# Patient Record
Sex: Female | Born: 1980 | Race: White | Hispanic: No | Smoking: Former smoker
Health system: Southern US, Community
[De-identification: ages and names within clinical notes are randomized; demographics above are authoritative.]

## PROBLEM LIST (undated history)

## (undated) DIAGNOSIS — K580 Irritable bowel syndrome with diarrhea: Secondary | ICD-10-CM

## (undated) DIAGNOSIS — R42 Dizziness and giddiness: Secondary | ICD-10-CM

## (undated) DIAGNOSIS — M545 Low back pain: Secondary | ICD-10-CM

## (undated) DIAGNOSIS — G629 Polyneuropathy, unspecified: Secondary | ICD-10-CM

## (undated) DIAGNOSIS — K089 Disorder of teeth and supporting structures, unspecified: Secondary | ICD-10-CM

## (undated) DIAGNOSIS — R269 Unspecified abnormalities of gait and mobility: Secondary | ICD-10-CM

## (undated) DIAGNOSIS — Z8489 Family history of other specified conditions: Secondary | ICD-10-CM

## (undated) DIAGNOSIS — Z87448 Personal history of other diseases of urinary system: Secondary | ICD-10-CM

## (undated) DIAGNOSIS — G894 Chronic pain syndrome: Secondary | ICD-10-CM

## (undated) DIAGNOSIS — D497 Neoplasm of unspecified behavior of endocrine glands and other parts of nervous system: Secondary | ICD-10-CM

## (undated) DIAGNOSIS — M542 Cervicalgia: Secondary | ICD-10-CM

## (undated) DIAGNOSIS — I10 Essential (primary) hypertension: Secondary | ICD-10-CM

## (undated) DIAGNOSIS — R000 Tachycardia, unspecified: Secondary | ICD-10-CM

## (undated) DIAGNOSIS — N390 Urinary tract infection, site not specified: Secondary | ICD-10-CM

## (undated) DIAGNOSIS — E669 Obesity, unspecified: Secondary | ICD-10-CM

## (undated) DIAGNOSIS — R51 Headache: Secondary | ICD-10-CM

## (undated) DIAGNOSIS — F32A Depression, unspecified: Secondary | ICD-10-CM

## (undated) DIAGNOSIS — F329 Major depressive disorder, single episode, unspecified: Secondary | ICD-10-CM

## (undated) DIAGNOSIS — R209 Unspecified disturbances of skin sensation: Secondary | ICD-10-CM

## (undated) DIAGNOSIS — F419 Anxiety disorder, unspecified: Secondary | ICD-10-CM

## (undated) HISTORY — DX: Chronic pain syndrome: G89.4

## (undated) HISTORY — DX: Unspecified abnormalities of gait and mobility: R26.9

## (undated) HISTORY — DX: Dizziness and giddiness: R42

## (undated) HISTORY — DX: Tachycardia, unspecified: R00.0

## (undated) HISTORY — DX: Cervicalgia: M54.2

## (undated) HISTORY — DX: Low back pain: M54.5

## (undated) HISTORY — PX: NO PAST SURGERIES: SHX2092

## (undated) HISTORY — DX: Unspecified disturbances of skin sensation: R20.9

## (undated) HISTORY — DX: Polyneuropathy, unspecified: G62.9

## (undated) HISTORY — DX: Obesity, unspecified: E66.9

---

## 1998-01-29 ENCOUNTER — Other Ambulatory Visit: Admission: RE | Admit: 1998-01-29 | Discharge: 1998-01-29 | Payer: Self-pay | Admitting: Obstetrics and Gynecology

## 1998-05-09 ENCOUNTER — Inpatient Hospital Stay (HOSPITAL_COMMUNITY): Admission: AD | Admit: 1998-05-09 | Discharge: 1998-05-09 | Payer: Self-pay | Admitting: Obstetrics and Gynecology

## 1998-08-11 ENCOUNTER — Inpatient Hospital Stay (HOSPITAL_COMMUNITY): Admission: AD | Admit: 1998-08-11 | Discharge: 1998-08-11 | Payer: Self-pay | Admitting: Obstetrics & Gynecology

## 1998-08-13 ENCOUNTER — Inpatient Hospital Stay (HOSPITAL_COMMUNITY): Admission: AD | Admit: 1998-08-13 | Discharge: 1998-08-15 | Payer: Self-pay | Admitting: Obstetrics and Gynecology

## 1998-08-13 ENCOUNTER — Inpatient Hospital Stay (HOSPITAL_COMMUNITY): Admission: AD | Admit: 1998-08-13 | Discharge: 1998-08-13 | Payer: Self-pay | Admitting: Obstetrics and Gynecology

## 1998-08-16 ENCOUNTER — Inpatient Hospital Stay (HOSPITAL_COMMUNITY): Admission: AD | Admit: 1998-08-16 | Discharge: 1998-08-18 | Payer: Self-pay | Admitting: Obstetrics & Gynecology

## 2007-08-23 ENCOUNTER — Emergency Department (HOSPITAL_COMMUNITY): Admission: EM | Admit: 2007-08-23 | Discharge: 2007-08-23 | Payer: Self-pay | Admitting: Emergency Medicine

## 2007-08-24 ENCOUNTER — Emergency Department (HOSPITAL_COMMUNITY): Admission: EM | Admit: 2007-08-24 | Discharge: 2007-08-24 | Payer: Self-pay | Admitting: Emergency Medicine

## 2007-12-13 ENCOUNTER — Encounter: Admission: RE | Admit: 2007-12-13 | Discharge: 2007-12-13 | Payer: Self-pay | Admitting: Family Medicine

## 2008-01-10 ENCOUNTER — Encounter: Admission: RE | Admit: 2008-01-10 | Discharge: 2008-01-10 | Payer: Self-pay | Admitting: Family Medicine

## 2009-07-10 IMAGING — US US ABDOMEN COMPLETE
1 series · 14 of 25 positions shown · non-contrast
Comparison: CT of 08/23/2007

CLINICAL DATA: Elevated liver function tests.  Hepatomegaly.

ABDOMEN ULTRASOUND
TECHNIQUE: Complete abdominal ultrasound examination was performed
including evaluation of the liver, gallbladder, bile ducts,
pancreas, kidneys, spleen, IVC, and abdominal aorta.

[Series 1: us abdomen complete · 0.32mm/px · 14 of 100 slices shown]
[im 1/100]
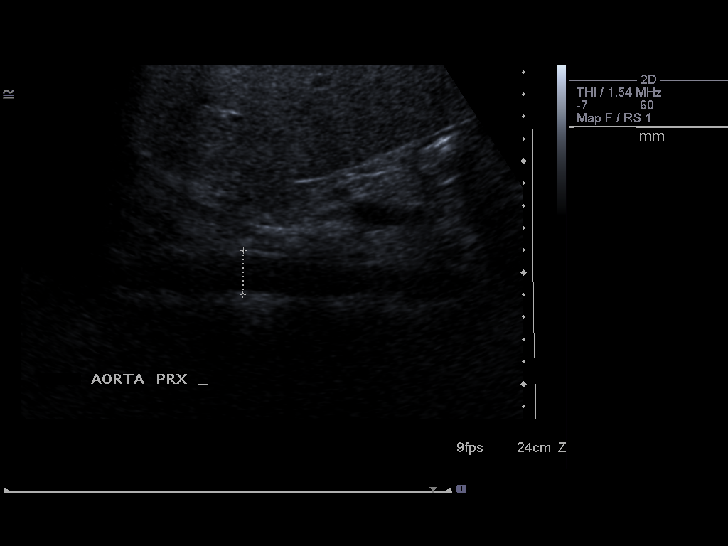
[im 9/100]
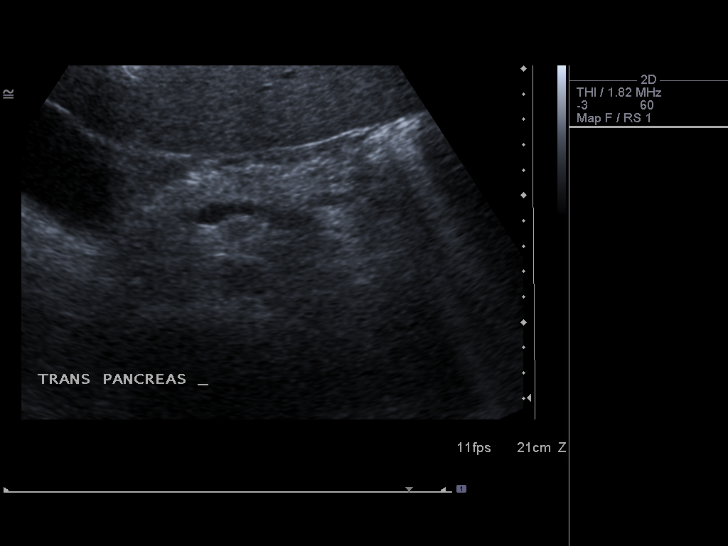
[im 17/100]
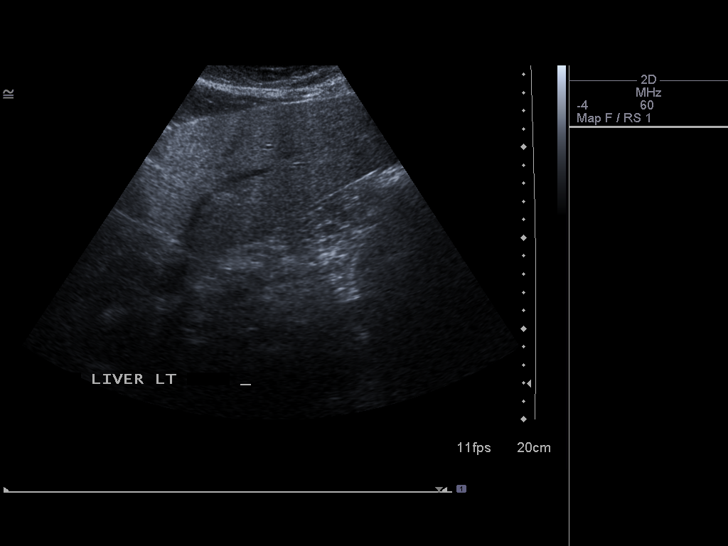
[im 25/100]
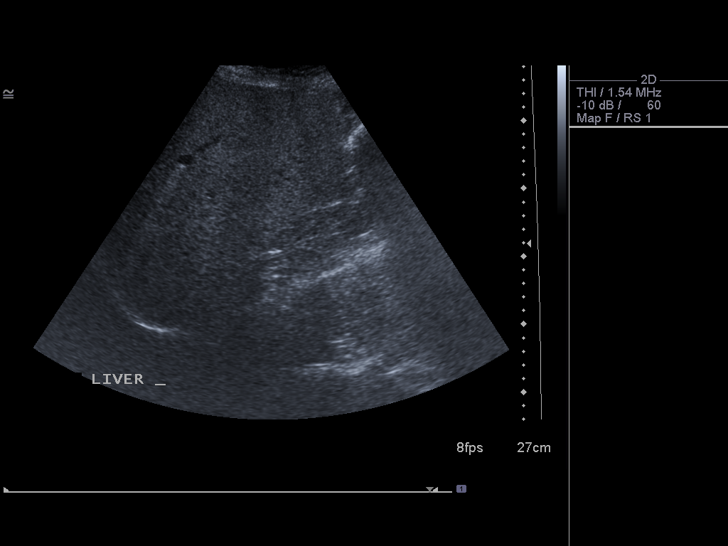
[im 34/100]
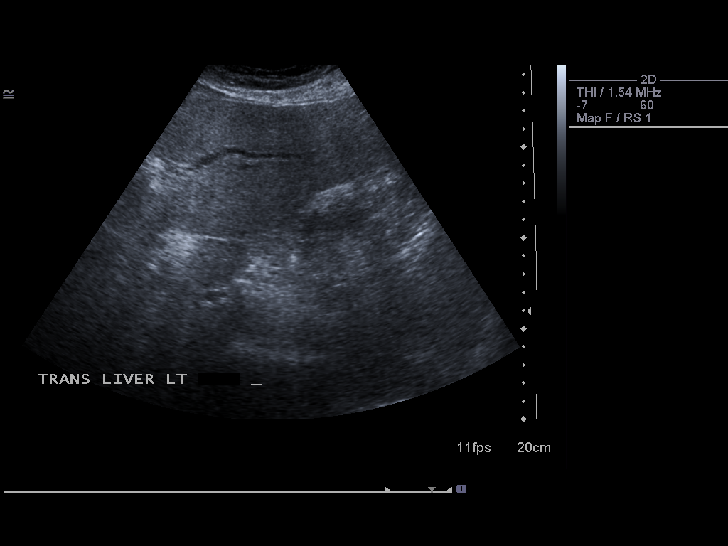
[im 38/100]
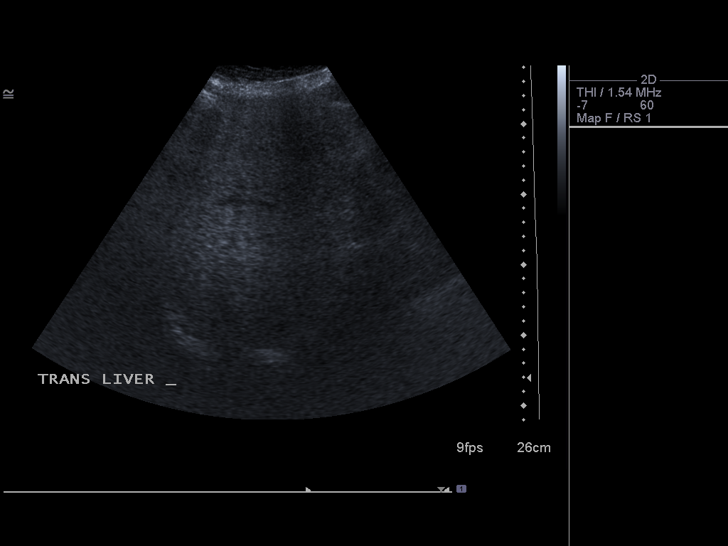
[im 46/100]
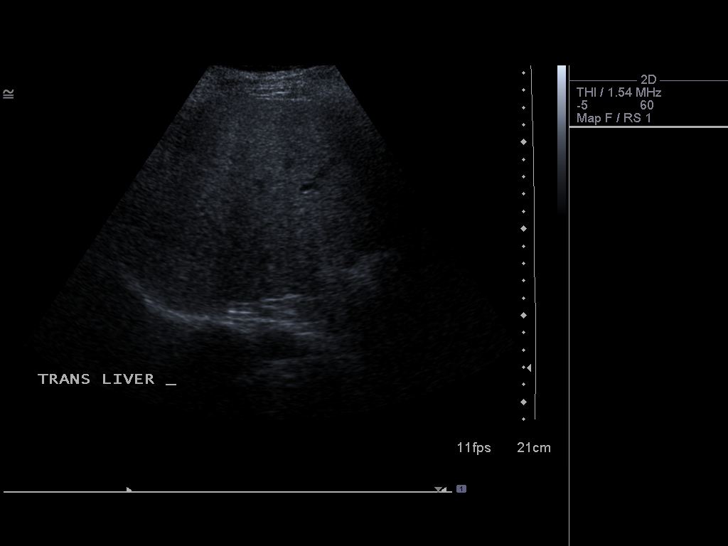
[im 54/100]
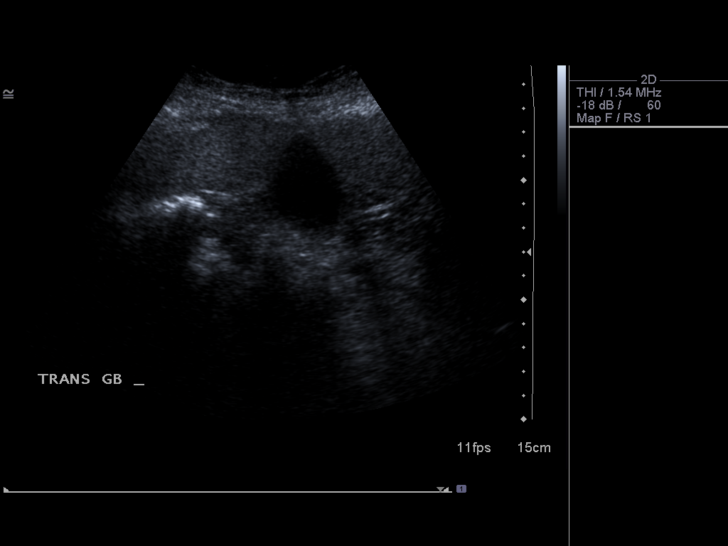
[im 62/100]
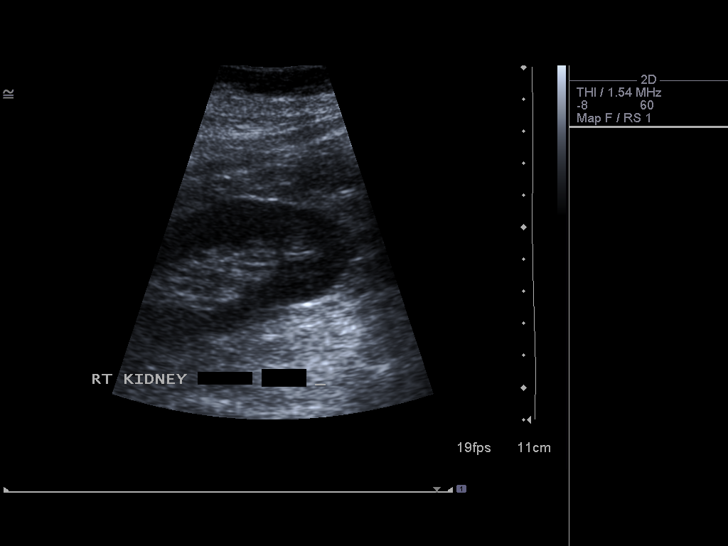
[im 67/100]
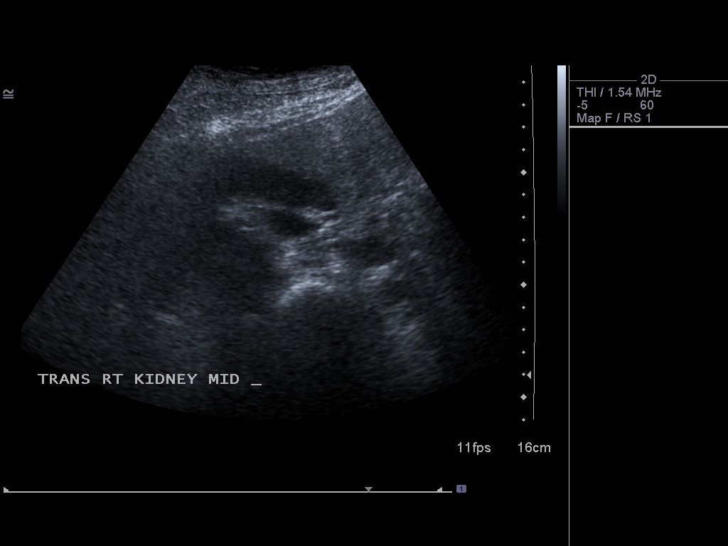
[im 75/100]
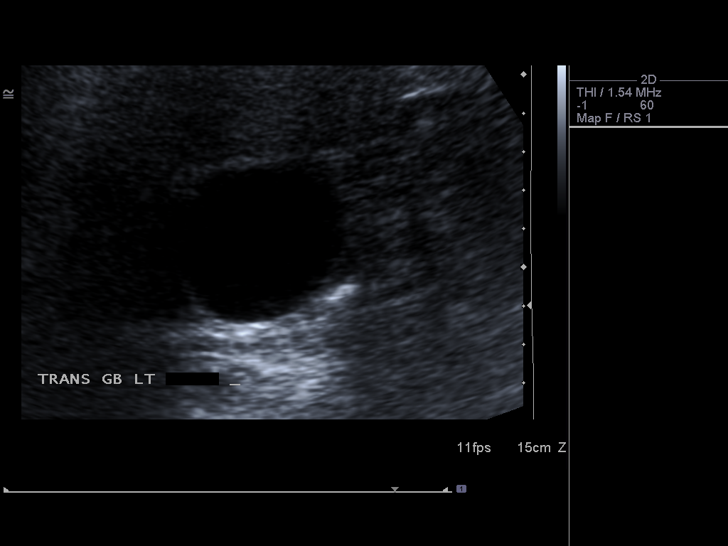
[im 83/100]
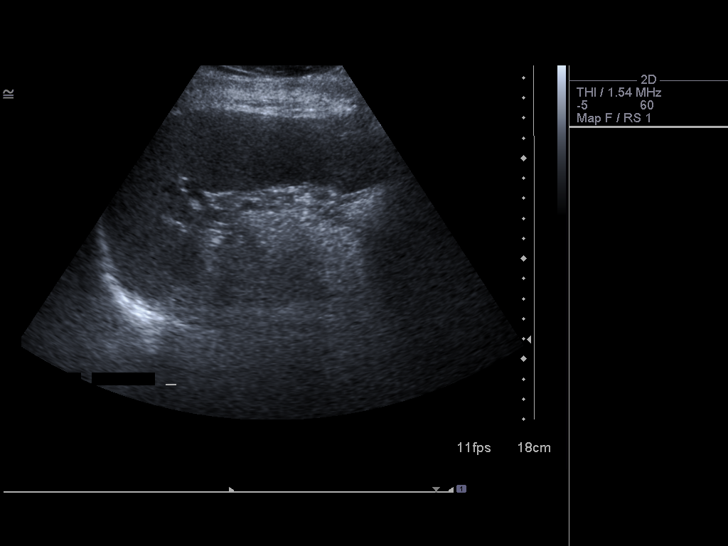
[im 91/100]
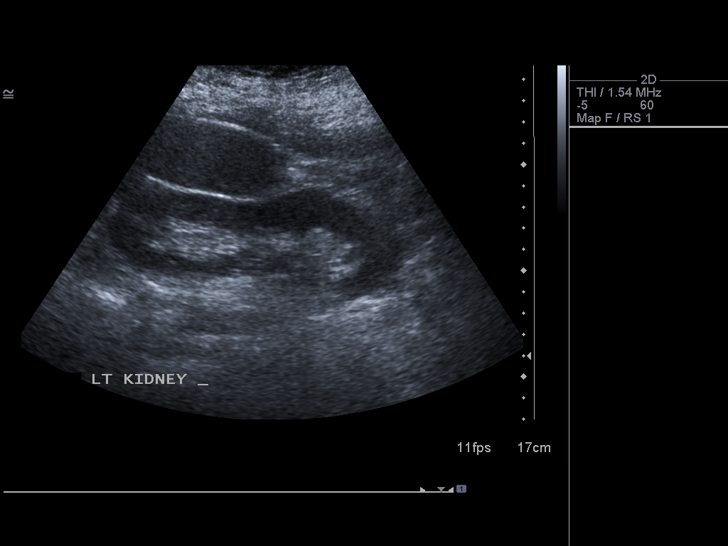
[im 100/100]
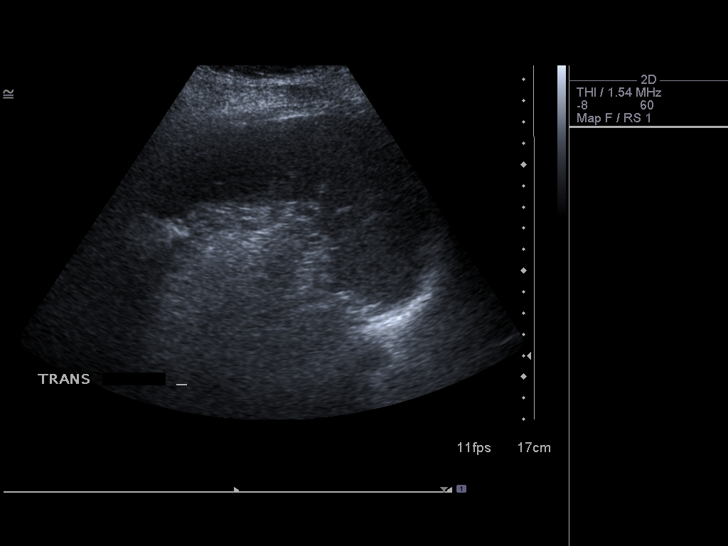

[14 of 25 positions shown; findings below may reference images not displayed]

FINDINGS: Gallbladder normal without wall thickening, stone, or
pericholecystic fluid.  Common duct normal at 5 mm.  Sonographic
Murphy's sign not elicited.

The liver is enlarged with increased echogenicity likely related to
fatty infiltration.  Maximal dimension 22 cm.

IVC normal.  Minimally limited evaluation of the pancreas.

The spleen is mildly enlarged with the splenic volume of
approximately 560 ml and maximum splenic length of 13.7 cm.

Right kidney 13.7 and left kidney 13.7cm.  No hydronephrosis.
Right-sided extrarenal pelvis.

Abdominal aorta nonaneurysmal without ascites.
IMPRESSION: 1.  Hepatomegaly with fatty infiltration of the liver, moderate.
2.  Mild splenomegaly.

## 2010-02-27 ENCOUNTER — Emergency Department (HOSPITAL_COMMUNITY)
Admission: EM | Admit: 2010-02-27 | Discharge: 2010-02-27 | Payer: Self-pay | Source: Home / Self Care | Admitting: Emergency Medicine

## 2010-03-06 ENCOUNTER — Encounter: Payer: Self-pay | Admitting: Family Medicine

## 2010-07-14 ENCOUNTER — Emergency Department (HOSPITAL_COMMUNITY)
Admission: EM | Admit: 2010-07-14 | Discharge: 2010-07-14 | Disposition: A | Discharge status: Home / Self Care | Payer: Medicaid Other | Attending: Emergency Medicine | Admitting: Emergency Medicine

## 2010-07-14 DIAGNOSIS — E669 Obesity, unspecified: Secondary | ICD-10-CM | POA: Insufficient documentation

## 2010-07-14 DIAGNOSIS — M79609 Pain in unspecified limb: Secondary | ICD-10-CM | POA: Insufficient documentation

## 2010-07-14 DIAGNOSIS — F329 Major depressive disorder, single episode, unspecified: Secondary | ICD-10-CM | POA: Insufficient documentation

## 2010-07-14 DIAGNOSIS — I10 Essential (primary) hypertension: Secondary | ICD-10-CM | POA: Insufficient documentation

## 2010-07-14 DIAGNOSIS — Z79899 Other long term (current) drug therapy: Secondary | ICD-10-CM | POA: Insufficient documentation

## 2010-07-14 DIAGNOSIS — F3289 Other specified depressive episodes: Secondary | ICD-10-CM | POA: Insufficient documentation

## 2010-11-10 LAB — POCT I-STAT, CHEM 8
BUN: 3 — ABNORMAL LOW
BUN: 4 — ABNORMAL LOW
Calcium, Ion: 0.93 — ABNORMAL LOW
Chloride: 98
Chloride: 98
Glucose, Bld: 118 — ABNORMAL HIGH
HCT: 44
HCT: 46
Hemoglobin: 15.6 — ABNORMAL HIGH

## 2010-11-10 LAB — T4: T4, Total: 6.5

## 2010-11-10 LAB — TSH: TSH: 2.173

## 2011-10-23 ENCOUNTER — Other Ambulatory Visit: Payer: Self-pay | Admitting: Neurosurgery

## 2011-10-24 ENCOUNTER — Other Ambulatory Visit: Payer: Self-pay | Admitting: Otolaryngology

## 2011-10-24 ENCOUNTER — Other Ambulatory Visit (HOSPITAL_COMMUNITY): Payer: Self-pay | Admitting: Otolaryngology

## 2011-10-24 ENCOUNTER — Other Ambulatory Visit (HOSPITAL_COMMUNITY): Payer: Self-pay | Admitting: General Practice

## 2011-10-24 DIAGNOSIS — D352 Benign neoplasm of pituitary gland: Secondary | ICD-10-CM

## 2011-10-24 DIAGNOSIS — E237 Disorder of pituitary gland, unspecified: Secondary | ICD-10-CM

## 2011-10-24 DIAGNOSIS — D353 Benign neoplasm of craniopharyngeal duct: Secondary | ICD-10-CM

## 2011-10-27 ENCOUNTER — Encounter (HOSPITAL_COMMUNITY): Payer: Self-pay | Admitting: Pharmacy Technician

## 2011-10-31 ENCOUNTER — Ambulatory Visit (HOSPITAL_COMMUNITY)
Admission: RE | Admit: 2011-10-31 | Discharge: 2011-10-31 | Disposition: A | Discharge status: Home / Self Care | Payer: Medicaid Other | Source: Ambulatory Visit | Attending: Otolaryngology | Admitting: Otolaryngology

## 2011-10-31 DIAGNOSIS — D353 Benign neoplasm of craniopharyngeal duct: Secondary | ICD-10-CM

## 2011-10-31 DIAGNOSIS — D497 Neoplasm of unspecified behavior of endocrine glands and other parts of nervous system: Secondary | ICD-10-CM | POA: Insufficient documentation

## 2011-10-31 DIAGNOSIS — D352 Benign neoplasm of pituitary gland: Secondary | ICD-10-CM

## 2011-10-31 DIAGNOSIS — R51 Headache: Secondary | ICD-10-CM | POA: Insufficient documentation

## 2011-10-31 LAB — CREATININE, SERUM: GFR calc non Af Amer: >90 mL/min (ref >90)

## 2011-10-31 MED ORDER — GADOBENATE DIMEGLUMINE 529 MG/ML IV SOLN
18.0000 mL | Freq: Once | INTRAVENOUS | Status: AC
  Administered 2011-10-31: 18 mL via INTRAVENOUS

## 2011-11-06 ENCOUNTER — Inpatient Hospital Stay (HOSPITAL_COMMUNITY): Admission: RE | Admit: 2011-11-06 | Discharge: 2011-11-06 | Payer: Medicaid Other | Source: Ambulatory Visit

## 2011-11-06 NOTE — Pre-Procedure Instructions (Addendum)
18 Kristina Carpenter  11/06/2011   Your procedure is scheduled on:  Friday, September 27th.   Report to [facility] at 5: AM.  Call this number if you have problems the morning of surgery: [phone]   Remember:   Do not eat food Or drink any liquid :After MIdnight   . Take these medicines the morning of surgery with A SIP OF WATER: Bupropion (Wellbutrin), Duloxetine (Cymbalta), Metoprolol (ToprolXL).  May   Take Oxycodone- Acetaminophen ( Percocet) and Cloazepam (Klonopin)  If needed.      Do not wear jewelry, make-up or nail polish.  Do not wear lotions, powders, or perfumes. You may wear deodorant.  Do not shave 48 hours prior to surgery. Men may shave face and neck.  Do not bring valuables to the hospital.  Contacts, dentures or bridgework may not be worn into surgery.  Leave suitcase in the car. After surgery it may be brought to your room.  For patients admitted to the hospital, checkout time is 11:00 AM the day of discharge.   Patients discharged the day of surgery will not be allowed to drive home.  Name and phone number of your driver: NA  Special Instructions: Shower using CHG 2 nights before surgery and the night before surgery.  If you shower the day of surgery use CHG.  Use special wash - you have one bottle of CHG for all showers.  You should use approximately 1/3 of the bottle for each shower.   Please read over the following fact sheets that you were given: Pain Booklet, Coughing and Deep Breathing, Blood Transfusion Information and MRSA Information

## 2011-11-07 ENCOUNTER — Encounter (HOSPITAL_COMMUNITY)
Admission: RE | Admit: 2011-11-07 | Discharge: 2011-11-07 | Disposition: A | Discharge status: Home / Self Care | Payer: Medicaid Other | Source: Ambulatory Visit | Attending: Neurosurgery | Admitting: Neurosurgery

## 2011-11-07 ENCOUNTER — Encounter (HOSPITAL_COMMUNITY): Payer: Self-pay

## 2011-11-07 HISTORY — DX: Disorder of teeth and supporting structures, unspecified: K08.9

## 2011-11-07 HISTORY — DX: Headache: R51

## 2011-11-07 HISTORY — DX: Irritable bowel syndrome with diarrhea: K58.0

## 2011-11-07 HISTORY — DX: Urinary tract infection, site not specified: N39.0

## 2011-11-07 HISTORY — DX: Anxiety disorder, unspecified: F41.9

## 2011-11-07 HISTORY — DX: Essential (primary) hypertension: I10

## 2011-11-07 HISTORY — DX: Neoplasm of unspecified behavior of endocrine glands and other parts of nervous system: D49.7

## 2011-11-07 HISTORY — DX: Depression, unspecified: F32.A

## 2011-11-07 HISTORY — DX: Personal history of other diseases of urinary system: Z87.448

## 2011-11-07 HISTORY — DX: Major depressive disorder, single episode, unspecified: F32.9

## 2011-11-07 HISTORY — DX: Family history of other specified conditions: Z84.89

## 2011-11-07 LAB — CBC
HCT: 42.1 % (ref 36.0–46.0)
Hemoglobin: 14.3 g/dL (ref 12.0–15.0)
MCH: 30.5 pg (ref 26.0–34.0)
MCV: 89.8 fL (ref 78.0–100.0)
RBC: 4.69 MIL/uL (ref 3.87–5.11)

## 2011-11-07 LAB — BASIC METABOLIC PANEL
CO2: 31 mEq/L (ref 19–32)
Glucose, Bld: 84 mg/dL (ref 70–99)
Potassium: 3.2 mEq/L — ABNORMAL LOW (ref 3.5–5.1)
Sodium: 139 mEq/L (ref 135–145)

## 2011-11-07 LAB — TYPE AND SCREEN: Antibody Screen: NEGATIVE

## 2011-11-09 MED ORDER — CEFAZOLIN SODIUM-DEXTROSE 2-3 GM-% IV SOLR
2.0000 g | INTRAVENOUS | Status: AC
  Administered 2011-11-10: 2 g via INTRAVENOUS
  Filled 2011-11-09: qty 50

## 2011-11-10 ENCOUNTER — Encounter (HOSPITAL_COMMUNITY)
Admission: RE | Disposition: A | Discharge status: Home / Self Care | Payer: Self-pay | Source: Ambulatory Visit | Attending: Neurosurgery

## 2011-11-10 ENCOUNTER — Ambulatory Visit (HOSPITAL_COMMUNITY): Payer: Medicaid Other | Admitting: Anesthesiology

## 2011-11-10 ENCOUNTER — Encounter (HOSPITAL_COMMUNITY): Payer: Self-pay | Admitting: Anesthesiology

## 2011-11-10 ENCOUNTER — Encounter (HOSPITAL_COMMUNITY): Payer: Self-pay | Admitting: Surgery

## 2011-11-10 ENCOUNTER — Inpatient Hospital Stay (HOSPITAL_COMMUNITY)
Admission: RE | Admit: 2011-11-10 | Discharge: 2011-11-16 | DRG: 614 | Disposition: A | Discharge status: Home / Self Care | Payer: Medicaid Other | Source: Ambulatory Visit | Attending: Neurosurgery | Admitting: Neurosurgery

## 2011-11-10 ENCOUNTER — Other Ambulatory Visit: Payer: Self-pay

## 2011-11-10 DIAGNOSIS — Z87440 Personal history of urinary (tract) infections: Secondary | ICD-10-CM

## 2011-11-10 DIAGNOSIS — D353 Benign neoplasm of craniopharyngeal duct: Principal | ICD-10-CM | POA: Diagnosis present

## 2011-11-10 DIAGNOSIS — R631 Polydipsia: Secondary | ICD-10-CM | POA: Diagnosis present

## 2011-11-10 DIAGNOSIS — Y838 Other surgical procedures as the cause of abnormal reaction of the patient, or of later complication, without mention of misadventure at the time of the procedure: Secondary | ICD-10-CM | POA: Diagnosis not present

## 2011-11-10 DIAGNOSIS — H9319 Tinnitus, unspecified ear: Secondary | ICD-10-CM | POA: Diagnosis present

## 2011-11-10 DIAGNOSIS — D352 Benign neoplasm of pituitary gland: Principal | ICD-10-CM | POA: Diagnosis present

## 2011-11-10 DIAGNOSIS — G988 Other disorders of nervous system: Secondary | ICD-10-CM | POA: Diagnosis not present

## 2011-11-10 DIAGNOSIS — F17200 Nicotine dependence, unspecified, uncomplicated: Secondary | ICD-10-CM | POA: Diagnosis present

## 2011-11-10 DIAGNOSIS — G609 Hereditary and idiopathic neuropathy, unspecified: Secondary | ICD-10-CM | POA: Diagnosis present

## 2011-11-10 DIAGNOSIS — B957 Other staphylococcus as the cause of diseases classified elsewhere: Secondary | ICD-10-CM | POA: Diagnosis present

## 2011-11-10 DIAGNOSIS — J342 Deviated nasal septum: Secondary | ICD-10-CM | POA: Diagnosis present

## 2011-11-10 DIAGNOSIS — J320 Chronic maxillary sinusitis: Secondary | ICD-10-CM | POA: Diagnosis present

## 2011-11-10 DIAGNOSIS — I10 Essential (primary) hypertension: Secondary | ICD-10-CM | POA: Diagnosis present

## 2011-11-10 HISTORY — PX: SINUS ENDO W/FUSION: SHX777

## 2011-11-10 HISTORY — PX: CRANIOTOMY: SHX93

## 2011-11-10 LAB — RAPID URINE DRUG SCREEN, HOSP PERFORMED
Amphetamines: NOT DETECTED
Benzodiazepines: POSITIVE — AB
Tetrahydrocannabinol: NOT DETECTED

## 2011-11-10 LAB — POCT I-STAT 4, (NA,K, GLUC, HGB,HCT)
HCT: 35 % — ABNORMAL LOW (ref 36.0–46.0)
Sodium: 140 mEq/L (ref 135–145)

## 2011-11-10 LAB — BASIC METABOLIC PANEL
BUN: 9 mg/dL (ref 6–23)
CO2: 28 mEq/L (ref 19–32)
Chloride: 105 mEq/L (ref 96–112)
Creatinine, Ser: 0.51 mg/dL (ref 0.50–1.10)
Glucose, Bld: 152 mg/dL — ABNORMAL HIGH (ref 70–99)

## 2011-11-10 SURGERY — CRANIOTOMY HYPOPHYSECTOMY TRANSNASAL APPROACH
Anesthesia: General | Site: Nose | Wound class: Clean Contaminated

## 2011-11-10 MED ORDER — LABETALOL HCL 5 MG/ML IV SOLN
10.0000 mg | INTRAVENOUS | Status: DC | PRN
  Filled 2011-11-10: qty 8

## 2011-11-10 MED ORDER — OXYMETAZOLINE HCL 0.05 % NA SOLN
NASAL | Status: DC | PRN
  Administered 2011-11-10 (×2): 1 via NASAL

## 2011-11-10 MED ORDER — NITROGLYCERIN IN D5W 200-5 MCG/ML-% IV SOLN
2.0000 ug/min | INTRAVENOUS | Status: DC
  Filled 2011-11-10: qty 250

## 2011-11-10 MED ORDER — ONDANSETRON HCL 4 MG/2ML IJ SOLN
4.0000 mg | INTRAMUSCULAR | Status: DC | PRN
  Administered 2011-11-13 – 2011-11-14 (×2): 4 mg via INTRAVENOUS
  Filled 2011-11-10 (×2): qty 2

## 2011-11-10 MED ORDER — OXYCODONE-ACETAMINOPHEN 5-325 MG PO TABS
1.0000 | ORAL_TABLET | Freq: Four times a day (QID) | ORAL | Status: DC | PRN
  Administered 2011-11-10: 2 via ORAL
  Filled 2011-11-10: qty 2

## 2011-11-10 MED ORDER — METOPROLOL SUCCINATE ER 50 MG PO TB24
50.0000 mg | ORAL_TABLET | Freq: Every day | ORAL | Status: DC
  Administered 2011-11-11 – 2011-11-16 (×6): 50 mg via ORAL
  Filled 2011-11-10 (×6): qty 1

## 2011-11-10 MED ORDER — LABETALOL HCL 5 MG/ML IV SOLN
INTRAVENOUS | Status: DC | PRN
  Administered 2011-11-10 (×4): 5 mg via INTRAVENOUS

## 2011-11-10 MED ORDER — TRAMADOL HCL 50 MG PO TABS
100.0000 mg | ORAL_TABLET | Freq: Three times a day (TID) | ORAL | Status: DC | PRN
  Administered 2011-11-15 – 2011-11-16 (×3): 100 mg via ORAL
  Filled 2011-11-10: qty 1
  Filled 2011-11-10: qty 2
  Filled 2011-11-10: qty 1
  Filled 2011-11-10: qty 2
  Filled 2011-11-10 (×2): qty 1

## 2011-11-10 MED ORDER — HYDROMORPHONE HCL PF 1 MG/ML IJ SOLN
INTRAMUSCULAR | Status: AC
  Administered 2011-11-10: 0.5 mg via INTRAVENOUS
  Filled 2011-11-10: qty 1

## 2011-11-10 MED ORDER — DULOXETINE HCL 60 MG PO CPEP
90.0000 mg | ORAL_CAPSULE | Freq: Every day | ORAL | Status: DC
  Administered 2011-11-10 – 2011-11-16 (×7): 90 mg via ORAL
  Filled 2011-11-10 (×7): qty 1

## 2011-11-10 MED ORDER — GABAPENTIN 300 MG PO CAPS: 600.0000 mg | ORAL_CAPSULE | Freq: Three times a day (TID) | ORAL | Status: DC

## 2011-11-10 MED ORDER — LIDOCAINE-EPINEPHRINE 1 %-1:100000 IJ SOLN
INTRAMUSCULAR | Status: DC | PRN
  Administered 2011-11-10: 5 mL
  Administered 2011-11-10: 20 mL

## 2011-11-10 MED ORDER — PHENYLEPHRINE HCL 10 MG/ML IJ SOLN
10.0000 mg | INTRAVENOUS | Status: DC | PRN
  Administered 2011-11-10: 10 ug/min via INTRAVENOUS

## 2011-11-10 MED ORDER — ONDANSETRON HCL 4 MG/2ML IJ SOLN
INTRAMUSCULAR | Status: DC | PRN
  Administered 2011-11-10: 4 mg via INTRAVENOUS

## 2011-11-10 MED ORDER — MIDAZOLAM HCL 5 MG/5ML IJ SOLN
INTRAMUSCULAR | Status: DC | PRN
  Administered 2011-11-10 (×2): 1 mg via INTRAVENOUS

## 2011-11-10 MED ORDER — OXYCODONE HCL 5 MG PO TABS
5.0000 mg | ORAL_TABLET | ORAL | Status: DC | PRN
  Administered 2011-11-10 – 2011-11-15 (×13): 5 mg via ORAL
  Filled 2011-11-10 (×14): qty 1

## 2011-11-10 MED ORDER — ADULT MULTIVITAMIN W/MINERALS CH
1.0000 | ORAL_TABLET | Freq: Every day | ORAL | Status: DC
  Administered 2011-11-10 – 2011-11-16 (×7): 1 via ORAL
  Filled 2011-11-10 (×7): qty 1

## 2011-11-10 MED ORDER — MORPHINE SULFATE 2 MG/ML IJ SOLN
2.0000 mg | INTRAMUSCULAR | Status: DC | PRN
  Administered 2011-11-10: 6 mg via INTRAVENOUS
  Administered 2011-11-10 (×2): 4 mg via INTRAVENOUS
  Administered 2011-11-11: 2 mg via INTRAVENOUS
  Administered 2011-11-11: 6 mg via INTRAVENOUS
  Administered 2011-11-11: 4 mg via INTRAVENOUS
  Administered 2011-11-11 (×4): 6 mg via INTRAVENOUS
  Administered 2011-11-11: 4 mg via INTRAVENOUS
  Administered 2011-11-11 (×2): 6 mg via INTRAVENOUS
  Administered 2011-11-11: 4 mg via INTRAVENOUS
  Administered 2011-11-12 – 2011-11-13 (×8): 6 mg via INTRAVENOUS
  Filled 2011-11-10 (×7): qty 3
  Filled 2011-11-10: qty 2
  Filled 2011-11-10 (×2): qty 3
  Filled 2011-11-10 (×2): qty 2
  Filled 2011-11-10 (×4): qty 3
  Filled 2011-11-10: qty 1
  Filled 2011-11-10: qty 2
  Filled 2011-11-10 (×3): qty 3

## 2011-11-10 MED ORDER — ACETAMINOPHEN 10 MG/ML IV SOLN
1000.0000 mg | Freq: Four times a day (QID) | INTRAVENOUS | Status: AC
  Administered 2011-11-10 – 2011-11-11 (×4): 1000 mg via INTRAVENOUS
  Filled 2011-11-10 (×4): qty 100

## 2011-11-10 MED ORDER — MICROFIBRILLAR COLL HEMOSTAT EX PADS
MEDICATED_PAD | CUTANEOUS | Status: DC | PRN
  Administered 2011-11-10: 1 via TOPICAL

## 2011-11-10 MED ORDER — ESMOLOL HCL 10 MG/ML IV SOLN
INTRAVENOUS | Status: DC | PRN
  Administered 2011-11-10 (×2): 20 mg via INTRAVENOUS
  Administered 2011-11-10: 30 mg via INTRAVENOUS
  Administered 2011-11-10: 20 mg via INTRAVENOUS
  Administered 2011-11-10: 30 mg via INTRAVENOUS

## 2011-11-10 MED ORDER — MAGNESIUM CITRATE PO SOLN
1.0000 | Freq: Once | ORAL | Status: AC | PRN
  Filled 2011-11-10: qty 296

## 2011-11-10 MED ORDER — GABAPENTIN 300 MG PO CAPS
900.0000 mg | ORAL_CAPSULE | Freq: Two times a day (BID) | ORAL | Status: DC
  Administered 2011-11-10 – 2011-11-16 (×12): 900 mg via ORAL
  Filled 2011-11-10 (×15): qty 3

## 2011-11-10 MED ORDER — FENTANYL CITRATE 0.05 MG/ML IJ SOLN
INTRAMUSCULAR | Status: DC | PRN
  Administered 2011-11-10: 25 ug via INTRAVENOUS
  Administered 2011-11-10 (×2): 50 ug via INTRAVENOUS
  Administered 2011-11-10: 25 ug via INTRAVENOUS
  Administered 2011-11-10 (×3): 50 ug via INTRAVENOUS
  Administered 2011-11-10 (×2): 25 ug via INTRAVENOUS
  Administered 2011-11-10 (×7): 50 ug via INTRAVENOUS

## 2011-11-10 MED ORDER — CEFAZOLIN SODIUM 1 G IJ SOLR
1.5000 g | Freq: Three times a day (TID) | INTRAMUSCULAR | Status: DC
  Administered 2011-11-10 – 2011-11-14 (×13): 1.5 g via INTRAVENOUS
  Filled 2011-11-10 (×17): qty 15

## 2011-11-10 MED ORDER — MORPHINE SULFATE 4 MG/ML IJ SOLN
INTRAMUSCULAR | Status: AC
  Administered 2011-11-10: 4 mg
  Filled 2011-11-10: qty 1

## 2011-11-10 MED ORDER — PROPOFOL 10 MG/ML IV BOLUS
INTRAVENOUS | Status: DC | PRN
  Administered 2011-11-10: 50 mg via INTRAVENOUS
  Administered 2011-11-10: 150 mg via INTRAVENOUS

## 2011-11-10 MED ORDER — THROMBIN 5000 UNITS EX KIT
PACK | CUTANEOUS | Status: DC | PRN
  Administered 2011-11-10 (×2): 5000 [IU] via TOPICAL

## 2011-11-10 MED ORDER — GLYCOPYRROLATE 0.2 MG/ML IJ SOLN
INTRAMUSCULAR | Status: DC | PRN
  Administered 2011-11-10: .2 mg via INTRAVENOUS
  Administered 2011-11-10: 0.6 mg via INTRAVENOUS

## 2011-11-10 MED ORDER — ONDANSETRON HCL 4 MG/2ML IJ SOLN: 4.0000 mg | Freq: Once | INTRAMUSCULAR | Status: DC | PRN

## 2011-11-10 MED ORDER — 0.9 % SODIUM CHLORIDE (POUR BTL) OPTIME
TOPICAL | Status: DC | PRN
  Administered 2011-11-10 (×2): 1000 mL

## 2011-11-10 MED ORDER — VECURONIUM BROMIDE 10 MG IV SOLR
INTRAVENOUS | Status: DC | PRN
  Administered 2011-11-10 (×2): 2 mg via INTRAVENOUS
  Administered 2011-11-10: 4 mg via INTRAVENOUS
  Administered 2011-11-10 (×2): 1 mg via INTRAVENOUS
  Administered 2011-11-10: 2 mg via INTRAVENOUS

## 2011-11-10 MED ORDER — ARTIFICIAL TEARS OP OINT
TOPICAL_OINTMENT | OPHTHALMIC | Status: DC | PRN
  Administered 2011-11-10: 1 via OPHTHALMIC

## 2011-11-10 MED ORDER — SODIUM CHLORIDE 0.9 % IV SOLN
INTRAVENOUS | Status: DC | PRN
  Administered 2011-11-10 (×2): via INTRAVENOUS

## 2011-11-10 MED ORDER — PANTOPRAZOLE SODIUM 40 MG IV SOLR
40.0000 mg | Freq: Every day | INTRAVENOUS | Status: DC
  Administered 2011-11-10 – 2011-11-11 (×2): 40 mg via INTRAVENOUS
  Filled 2011-11-10 (×3): qty 40

## 2011-11-10 MED ORDER — HEMOSTATIC AGENTS (NO CHARGE) OPTIME
TOPICAL | Status: DC | PRN
  Administered 2011-11-10: 1 via TOPICAL

## 2011-11-10 MED ORDER — ONDANSETRON HCL 4 MG PO TABS: 4.0000 mg | ORAL_TABLET | ORAL | Status: DC | PRN

## 2011-11-10 MED ORDER — GABAPENTIN 300 MG PO CAPS
900.0000 mg | ORAL_CAPSULE | Freq: Two times a day (BID) | ORAL | Status: DC
  Filled 2011-11-10 (×2): qty 3

## 2011-11-10 MED ORDER — GABAPENTIN 300 MG PO CAPS
600.0000 mg | ORAL_CAPSULE | Freq: Every day | ORAL | Status: DC
  Administered 2011-11-10 – 2011-11-15 (×6): 600 mg via ORAL
  Filled 2011-11-10 (×7): qty 2

## 2011-11-10 MED ORDER — PROMETHAZINE HCL 25 MG PO TABS
12.5000 mg | ORAL_TABLET | ORAL | Status: DC | PRN
  Administered 2011-11-15: 25 mg via ORAL
  Filled 2011-11-10: qty 1

## 2011-11-10 MED ORDER — CLONAZEPAM 1 MG PO TABS
1.0000 mg | ORAL_TABLET | Freq: Two times a day (BID) | ORAL | Status: DC | PRN
  Administered 2011-11-14 – 2011-11-15 (×4): 1 mg via ORAL
  Filled 2011-11-10 (×4): qty 1

## 2011-11-10 MED ORDER — POTASSIUM CHLORIDE 10 MEQ/50ML IV SOLN
INTRAVENOUS | Status: AC
  Filled 2011-11-10: qty 150

## 2011-11-10 MED ORDER — HYDROCORTISONE SOD SUCCINATE 100 MG IJ SOLR
50.0000 mg | Freq: Three times a day (TID) | INTRAMUSCULAR | Status: AC
  Administered 2011-11-10 – 2011-11-11 (×3): 50 mg via INTRAVENOUS
  Filled 2011-11-10 (×3): qty 1

## 2011-11-10 MED ORDER — HYDRALAZINE HCL 20 MG/ML IJ SOLN
INTRAMUSCULAR | Status: DC | PRN
  Administered 2011-11-10 (×3): 5 mg via INTRAVENOUS

## 2011-11-10 MED ORDER — LIDOCAINE HCL (CARDIAC) 20 MG/ML IV SOLN
INTRAVENOUS | Status: DC | PRN
  Administered 2011-11-10: 100 mg via INTRAVENOUS

## 2011-11-10 MED ORDER — HYDROCORTISONE SOD SUCCINATE 100 MG IJ SOLR
INTRAMUSCULAR | Status: DC | PRN
  Administered 2011-11-10: 100 mg via INTRAVENOUS

## 2011-11-10 MED ORDER — SENNA 8.6 MG PO TABS
1.0000 | ORAL_TABLET | Freq: Two times a day (BID) | ORAL | Status: DC
  Administered 2011-11-10 – 2011-11-16 (×12): 8.6 mg via ORAL
  Filled 2011-11-10 (×15): qty 1

## 2011-11-10 MED ORDER — BUPROPION HCL ER (XL) 300 MG PO TB24
300.0000 mg | ORAL_TABLET | Freq: Every day | ORAL | Status: DC
  Administered 2011-11-10 – 2011-11-16 (×7): 300 mg via ORAL
  Filled 2011-11-10 (×7): qty 1

## 2011-11-10 MED ORDER — PHENYLEPHRINE HCL 10 MG/ML IJ SOLN
INTRAMUSCULAR | Status: DC | PRN
  Administered 2011-11-10: 80 ug via INTRAVENOUS

## 2011-11-10 MED ORDER — OXYCODONE HCL 5 MG PO TABS: 5.0000 mg | ORAL_TABLET | Freq: Once | ORAL | Status: DC | PRN

## 2011-11-10 MED ORDER — HYDROMORPHONE HCL PF 1 MG/ML IJ SOLN
0.2500 mg | INTRAMUSCULAR | Status: DC | PRN
  Administered 2011-11-10: 0.5 mg via INTRAVENOUS

## 2011-11-10 MED ORDER — ROCURONIUM BROMIDE 100 MG/10ML IV SOLN
INTRAVENOUS | Status: DC | PRN
  Administered 2011-11-10: 50 mg via INTRAVENOUS

## 2011-11-10 MED ORDER — MEPERIDINE HCL 25 MG/ML IJ SOLN: 6.2500 mg | INTRAMUSCULAR | Status: DC | PRN

## 2011-11-10 MED ORDER — HYDROCORTISONE SOD SUCCINATE 100 MG IJ SOLR
25.0000 mg | Freq: Three times a day (TID) | INTRAMUSCULAR | Status: AC
  Administered 2011-11-11 – 2011-11-12 (×3): 25 mg via INTRAVENOUS
  Filled 2011-11-10 (×3): qty 0.5

## 2011-11-10 MED ORDER — NEOSTIGMINE METHYLSULFATE 1 MG/ML IJ SOLN
INTRAMUSCULAR | Status: DC | PRN
  Administered 2011-11-10: 1 mg via INTRAVENOUS
  Administered 2011-11-10: 4 mg via INTRAVENOUS

## 2011-11-10 MED ORDER — OXYCODONE HCL 5 MG/5ML PO SOLN: 5.0000 mg | Freq: Once | ORAL | Status: DC | PRN

## 2011-11-10 MED ORDER — POTASSIUM CHLORIDE 10 MEQ/100ML IV SOLN
INTRAVENOUS | Status: DC | PRN
  Administered 2011-11-10 (×3): 10 meq via INTRAVENOUS

## 2011-11-10 SURGICAL SUPPLY — 144 items
ATTRACTOMAT 16X20 MAGNETIC DRP (DRAPES) IMPLANT
BENZOIN TINCTURE PRP APPL 2/3 (GAUZE/BANDAGES/DRESSINGS) ×3 IMPLANT
BLADE EYE SICKLE 84 5 BEAV (BLADE) ×3 IMPLANT
BLADE ROTATE RAD 40 4 M4 (BLADE) IMPLANT
BLADE ROTATE TRICUT 4X13 M4 (BLADE) IMPLANT
BLADE SURG 10 STRL SS (BLADE) ×3 IMPLANT
BLADE SURG 11 STRL SS (BLADE) ×6 IMPLANT
BLADE SURG 15 STRL LF DISP TIS (BLADE) ×4 IMPLANT
BLADE SURG 15 STRL SS (BLADE) ×2
BUR MATCHSTICK NEURO 3.0 LAGG (BURR) ×3 IMPLANT
CANISTER SUCTION 2500CC (MISCELLANEOUS) ×3 IMPLANT
CATH ROBINSON RED A/P 14FR (CATHETERS) IMPLANT
CLOTH BEACON ORANGE TIMEOUT ST (SAFETY) ×6 IMPLANT
COAGULATOR SUCT SWTCH 10FR 6 (ELECTROSURGICAL) ×3 IMPLANT
CONT SPEC 4OZ CLIKSEAL STRL BL (MISCELLANEOUS) ×9 IMPLANT
CORDS BIPOLAR (ELECTRODE) ×3 IMPLANT
COTTONBALL LRG STERILE PKG (GAUZE/BANDAGES/DRESSINGS) ×3 IMPLANT
CRADLE DONUT ADULT HEAD (MISCELLANEOUS) IMPLANT
DECANTER SPIKE VIAL GLASS SM (MISCELLANEOUS) ×3 IMPLANT
DEPRESSOR TONGUE BLADE STERILE (MISCELLANEOUS) ×3 IMPLANT
DISPOSABLE IRRIGATION CASSETTE ×3 IMPLANT
DRAIN SUBARACHNOID (WOUND CARE) IMPLANT
DRAPE C-ARM 42X72 X-RAY (DRAPES) IMPLANT
DRAPE EENT ADH APERT 15X15 STR (DRAPES) IMPLANT
DRAPE INCISE IOBAN 66X45 STRL (DRAPES) ×3 IMPLANT
DRAPE MICROSCOPE LEICA (MISCELLANEOUS) IMPLANT
DRAPE POUCH INSTRU U-SHP 10X18 (DRAPES) IMPLANT
DRAPE PROXIMA HALF (DRAPES) ×12 IMPLANT
DRESSING NASAL POPE 10X1.5X2.5 (GAUZE/BANDAGES/DRESSINGS) ×4 IMPLANT
DRESSING TELFA 8X3 (GAUZE/BANDAGES/DRESSINGS) IMPLANT
DRSG NASAL POPE 10X1.5X2.5 (GAUZE/BANDAGES/DRESSINGS) ×6
DRSG NASOPORE 8CM (GAUZE/BANDAGES/DRESSINGS) IMPLANT
DURAPREP 26ML APPLICATOR (WOUND CARE) ×3 IMPLANT
DURASEAL SPINE SEALANT 3ML (MISCELLANEOUS) ×3 IMPLANT
ELECT CAUTERY BLADE 6.4 (BLADE) ×3 IMPLANT
ELECT COATED BLADE 2.86 ST (ELECTRODE) ×3 IMPLANT
ELECT NEEDLE TIP 2.8 STRL (NEEDLE) ×3 IMPLANT
ELECT REM PT RETURN 9FT ADLT (ELECTROSURGICAL) ×3
ELECTRODE REM PT RTRN 9FT ADLT (ELECTROSURGICAL) ×2 IMPLANT
EXTENDED TIP APPLICATOR 8CM ×3 IMPLANT
FILTER ARTHROSCOPY CONVERTOR (FILTER) ×3 IMPLANT
FLOSEAL 10ML (HEMOSTASIS) IMPLANT
GAUZE PACKING FOLDED 1IN STRL (GAUZE/BANDAGES/DRESSINGS) ×3 IMPLANT
GAUZE PACKING FOLDED 2  STR (GAUZE/BANDAGES/DRESSINGS) ×1
GAUZE PACKING FOLDED 2 STR (GAUZE/BANDAGES/DRESSINGS) ×2 IMPLANT
GAUZE SPONGE 2X2 8PLY STRL LF (GAUZE/BANDAGES/DRESSINGS) ×2 IMPLANT
GLOVE BIO SURGEON STRL SZ 6.5 (GLOVE) IMPLANT
GLOVE BIO SURGEON STRL SZ7 (GLOVE) IMPLANT
GLOVE BIO SURGEON STRL SZ7.5 (GLOVE) IMPLANT
GLOVE BIO SURGEON STRL SZ8 (GLOVE) IMPLANT
GLOVE BIO SURGEON STRL SZ8.5 (GLOVE) IMPLANT
GLOVE BIOGEL M 8.0 STRL (GLOVE) IMPLANT
GLOVE BIOGEL PI IND STRL 7.5 (GLOVE) ×2 IMPLANT
GLOVE BIOGEL PI INDICATOR 7.5 (GLOVE) ×1
GLOVE ECLIPSE 6.5 STRL STRAW (GLOVE) ×6 IMPLANT
GLOVE ECLIPSE 7.0 STRL STRAW (GLOVE) IMPLANT
GLOVE ECLIPSE 7.5 STRL STRAW (GLOVE) ×6 IMPLANT
GLOVE ECLIPSE 8.0 STRL XLNG CF (GLOVE) IMPLANT
GLOVE ECLIPSE 8.5 STRL (GLOVE) IMPLANT
GLOVE EXAM NITRILE LRG STRL (GLOVE) IMPLANT
GLOVE EXAM NITRILE MD LF STRL (GLOVE) IMPLANT
GLOVE EXAM NITRILE XL STR (GLOVE) IMPLANT
GLOVE EXAM NITRILE XS STR PU (GLOVE) IMPLANT
GLOVE INDICATOR 6.5 STRL GRN (GLOVE) IMPLANT
GLOVE INDICATOR 7.0 STRL GRN (GLOVE) IMPLANT
GLOVE INDICATOR 7.5 STRL GRN (GLOVE) IMPLANT
GLOVE INDICATOR 8.0 STRL GRN (GLOVE) IMPLANT
GLOVE INDICATOR 8.5 STRL (GLOVE) IMPLANT
GLOVE OPTIFIT SS 7.5 STRL LX (GLOVE) IMPLANT
GLOVE OPTIFIT SS 8.0 STRL (GLOVE) IMPLANT
GLOVE SURG SS PI 6.5 STRL IVOR (GLOVE) IMPLANT
GLOVE SURG SS PI 7.5 STRL IVOR (GLOVE) ×3 IMPLANT
GOWN BRE IMP SLV AUR LG STRL (GOWN DISPOSABLE) ×9 IMPLANT
GOWN BRE IMP SLV AUR XL STRL (GOWN DISPOSABLE) ×3 IMPLANT
GOWN STRL NON-REIN LRG LVL3 (GOWN DISPOSABLE) ×6 IMPLANT
GOWN STRL REIN 2XL LVL4 (GOWN DISPOSABLE) ×3 IMPLANT
HEMOSTAT SURGICEL 2X14 (HEMOSTASIS) ×3 IMPLANT
KIT BASIN OR (CUSTOM PROCEDURE TRAY) ×3 IMPLANT
KIT ROOM TURNOVER OR (KITS) ×3 IMPLANT
LIDOCAINE 0.5% WITH EPI 1:200,000 IMPLANT
LIDOCAINE 1% WITH EPI 1:100,000 IMPLANT
MARKER SKIN DUAL TIP RULER LAB (MISCELLANEOUS) ×3 IMPLANT
MARKER SPHERE PSV REFLC NDI (MISCELLANEOUS) ×12 IMPLANT
NEEDLE 27GAX1X1/2 (NEEDLE) IMPLANT
NEEDLE HYPO 25X1 1.5 SAFETY (NEEDLE) ×3 IMPLANT
NEEDLE SPNL 20GX3.5 QUINCKE YW (NEEDLE) ×3 IMPLANT
NEEDLE SPNL 22GX3.5 QUINCKE BK (NEEDLE) IMPLANT
NEEDLE SPNL 25GX3.5 QUINCKE BL (NEEDLE) IMPLANT
NS IRRIG 1000ML POUR BTL (IV SOLUTION) ×6 IMPLANT
PAD ARMBOARD 7.5X6 YLW CONV (MISCELLANEOUS) ×9 IMPLANT
PATTIES SURGICAL .25X.25 (GAUZE/BANDAGES/DRESSINGS) ×3 IMPLANT
PATTIES SURGICAL .5 X.5 (GAUZE/BANDAGES/DRESSINGS) ×3 IMPLANT
PATTIES SURGICAL .5 X3 (DISPOSABLE) ×9 IMPLANT
PENCIL BUTTON HOLSTER BLD 10FT (ELECTRODE) ×6 IMPLANT
PENCIL FOOT CONTROL (ELECTRODE) ×3 IMPLANT
PIN MAYFIELD SKULL DISP (PIN) ×3 IMPLANT
ROTABLE FUSION BLADE, TRICUT, 13CM ×3 IMPLANT
RUBBERBAND STERILE (MISCELLANEOUS) ×6 IMPLANT
SHEATH ENDOSCRUB 0 DEG (SHEATH) IMPLANT
SHEATH ENDOSCRUB 30 DEG (SHEATH) IMPLANT
SHEATH ENDOSCRUB 45 DEG (SHEATH) IMPLANT
SHEET SIL 040 (INSTRUMENTS) IMPLANT
SOLUTION ANTI FOG 6CC (MISCELLANEOUS) ×3 IMPLANT
SPECIMEN JAR SMALL (MISCELLANEOUS) IMPLANT
SPLINT NASAL DOYLE BI-VL (GAUZE/BANDAGES/DRESSINGS) IMPLANT
SPONGE GAUZE 2X2 STER 10/PKG (GAUZE/BANDAGES/DRESSINGS) ×1
SPONGE GAUZE 4X4 12PLY (GAUZE/BANDAGES/DRESSINGS) IMPLANT
SPONGE LAP 4X18 X RAY DECT (DISPOSABLE) IMPLANT
SPONGE SURGIFOAM ABS GEL SZ50 (HEMOSTASIS) IMPLANT
STAPLER SKIN PROX WIDE 3.9 (STAPLE) IMPLANT
STRAIGHTSHOT IRRIGATOR TUBING ×3 IMPLANT
STRIP CLOSURE SKIN 1/2X4 (GAUZE/BANDAGES/DRESSINGS) IMPLANT
STRIP CLOSURE SKIN 1/4X4 (GAUZE/BANDAGES/DRESSINGS) ×3 IMPLANT
SUT BONE WAX W31G (SUTURE) IMPLANT
SUT CHROMIC 3 0 PS 2 (SUTURE) IMPLANT
SUT CHROMIC 4 0 P 3 18 (SUTURE) ×3 IMPLANT
SUT ETHILON 3 0 PS 1 (SUTURE) IMPLANT
SUT ETHILON 4 0 PS 2 18 (SUTURE) IMPLANT
SUT ETHILON 6 0 P 1 (SUTURE) IMPLANT
SUT NOVAFIL 6 0 PRE 2 4412 13 (SUTURE) IMPLANT
SUT PLAIN 4 0 ~~LOC~~ 1 (SUTURE) IMPLANT
SUT PROLENE 6 0 BV (SUTURE) IMPLANT
SUT SILK 2 0 FS (SUTURE) ×3 IMPLANT
SUT VIC AB 2-0 CT1 27 (SUTURE)
SUT VIC AB 2-0 CT1 27XBRD (SUTURE) IMPLANT
SUT VIC AB 2-0 CT2 18 VCP726D (SUTURE) IMPLANT
SUT VIC AB 3-0 SH 8-18 (SUTURE) ×3 IMPLANT
SUT VIC AB 4-0 P-3 18X BRD (SUTURE) IMPLANT
SUT VIC AB 4-0 P3 18 (SUTURE)
SWAB COLLECTION DEVICE MRSA (MISCELLANEOUS) ×3 IMPLANT
SYR 50ML SLIP (SYRINGE) IMPLANT
SYR 5ML LL (SYRINGE) ×3 IMPLANT
SYR CONTROL 10ML LL (SYRINGE) ×3 IMPLANT
SYR TB 1ML 25GX5/8 (SYRINGE) IMPLANT
SYRINGE 10CC LL (SYRINGE) ×3 IMPLANT
TOWEL OR 17X24 6PK STRL BLUE (TOWEL DISPOSABLE) ×6 IMPLANT
TOWEL OR 17X26 10 PK STRL BLUE (TOWEL DISPOSABLE) ×3 IMPLANT
TRAY ENT MC OR (CUSTOM PROCEDURE TRAY) ×6 IMPLANT
TRAY FOLEY CATH 14FRSI W/METER (CATHETERS) ×3 IMPLANT
TUBE ANAEROBIC SPECIMEN COL (MISCELLANEOUS) ×3 IMPLANT
TUBE CONNECTING 12X1/4 (SUCTIONS) ×3 IMPLANT
UNDERPAD 30X30 INCONTINENT (UNDERPADS AND DIAPERS) IMPLANT
WATER STERILE IRR 1000ML POUR (IV SOLUTION) ×3 IMPLANT
WIPE INSTRUMENT VISIWIPE 73X73 (MISCELLANEOUS) IMPLANT

## 2011-11-10 NOTE — Preoperative (Signed)
Beta Blockers   Reason not to administer Beta Blockers:Not Applicable 

## 2011-11-10 NOTE — H&P (Signed)
11/10/11 7:06 AM  Kristina Carpenter  PREOPERATIVE HISTORY AND PHYSICAL  CHIEF COMPLAINT: pituitary mass/cyst  HISTORY: This is a 31yo female-year-old who presents with an incidentally discovered cystic pituitary mass. This has grown in size over the past several months and opthalmologic exam suggested possible visual changes Dr. Emeline Darling was consulted for assistance with the approach to endonasal transsphenoidal resection of the cystic mass. She also has maxillary sinusitis and will undergo maxillary antrostomies at the same time.  Dr. Emeline Darling, [place] Riley has discussed the risks (recurrence, CSF leak, injury to the optic nerve or orbits, blindness, bleeding, carotid injury, etc.), benefits, and alternatives of this procedure. The patient understands the risks and would like to proceed with the procedure. The chances of success of the procedure are >50% and the patient understands this. I personally performed an examination of the patient within 24 hours of the procedure.  PAST MEDICAL HISTORY: Past Medical History  Diagnosis Date  . Headache   . Pituitary neoplasm   . Hypertension   . Depression   . Anxiety   . H/O pyelonephritis   . Frequent UTI   . Irritable bowel syndrome with diarrhea   . Family history of anesthesia complication     pt is adopted  . Poor dentition     PAST SURGICAL HISTORY: Past Surgical History  Procedure Date  . No past surgeries     MEDICATIONS: No current facility-administered medications on file prior to encounter.   Current Outpatient Prescriptions on File Prior to Encounter  Medication Sig Dispense Refill  . buPROPion (WELLBUTRIN XL) 300 MG 24 hr tablet Take 300 mg by mouth daily.      . clonazePAM (KLONOPIN) 1 MG tablet Take 1 mg by mouth 2 (two) times daily as needed. For anxiety      . DULoxetine (CYMBALTA) 30 MG capsule Take 90 mg by mouth daily.      Marland Kitchen gabapentin (NEURONTIN) 300 MG capsule Take 600-900 mg by mouth 3 (three) times daily. 900mg  in the  morning 600mg  at lunch 900 at bedtime      . metoprolol succinate (TOPROL-XL) 50 MG 24 hr tablet Take 50 mg by mouth daily. Take with or immediately following a meal.        ALLERGIES: No Known Allergies   SOCIAL HISTORY: History   Social History  . Marital Status: Legally Separated    Spouse Name: N/A    Number of Children: N/A  . Years of Education: N/A   Occupational History  . Not on file.   Social History Main Topics  . Smoking status: Current Every Day Smoker -- 1.0 packs/day for 16 years    Types: Cigarettes  . Smokeless tobacco: Never Used  . Alcohol Use: No  . Drug Use: No  . Sexually Active: Not Currently   Other Topics Concern  . Not on file   Social History Narrative  . No narrative on file   FAMILY HISTORY: Family History  Problem Relation Age of Onset  . Adopted: Yes    REVIEW OF SYSTEMS:  Negative x 10 systems except per HPI   PHYSICAL EXAM:  GENERAL:  NAD VITAL SIGNS:   Filed Vitals:   11/10/11 0635  BP: 147/92  Pulse: 98  Temp: 98.5 F (36.9 C)  Resp: 18  _  SKIN:  Warm, dry HEENT:  Oral cavity and nasal cavity grossly clear NECK:  supple LYMPH:  No LAD LUNGS:  Grossly clear CARDIOVASCULAR:  RRR ABDOMEN:  Soft, NT MUSCULOSKELETAL: normal  strength PSYCH:  Normal for age NEUROLOGIC:  CN 2-12 grossly intact and symmetric  DIAGNOSTIC STUDIES: CT and MRI reviewed, show ~1cm mid-pituitary cystic mass just to left of midline with BL maxillary mucosal thickening.  ASSESSMENT AND PLAN: Plan to proceed with bilateral maxillary antrostomies and endoscopic transsphenoidal approach to resection of pituitary mass. Patient understands the risks, benefits, and alternatives.  11/10/11 7:06 AM Kristina Carpenter

## 2011-11-10 NOTE — Anesthesia Preprocedure Evaluation (Addendum)
Anesthesia Evaluation  Patient identified by MRN, date of birth, ID band Patient awake    Reviewed: Allergy & Precautions, H&P , NPO status , Patient's Chart, lab work & pertinent test results  History of Anesthesia Complications (+) Family history of anesthesia reaction  Airway Mallampati: II  Neck ROM: Full    Dental  (+) Dental Advisory Given and Poor Dentition Pt with poor dentition, several teeth black and worn to gums.  Pt states no teeth are loose.:   Pulmonary          Cardiovascular hypertension, Pt. on medications and Pt. on home beta blockers     Neuro/Psych  Headaches,    GI/Hepatic   Endo/Other    Renal/GU      Musculoskeletal   Abdominal   Peds  Hematology   Anesthesia Other Findings   Reproductive/Obstetrics                          Anesthesia Physical Anesthesia Plan  ASA: III  Anesthesia Plan: General   Post-op Pain Management:    Induction: Intravenous  Airway Management Planned: Oral ETT  Additional Equipment:   Intra-op Plan:   Post-operative Plan: Extubation in OR  Informed Consent: I have reviewed the patients History and Physical, chart, labs and discussed the procedure including the risks, benefits and alternatives for the proposed anesthesia with the patient or authorized representative who has indicated his/her understanding and acceptance.     Plan Discussed with: CRNA and Surgeon  Anesthesia Plan Comments:         Anesthesia Quick Evaluation

## 2011-11-10 NOTE — Anesthesia Postprocedure Evaluation (Signed)
Anesthesia Post Note  Patient: Kristina Carpenter  Procedure(s) Performed: Procedure(s) (LRB): CRANIOTOMY HYPOPHYSECTOMY TRANSNASAL APPROACH (N/A) ENDOSCOPIC SINUS SURGERY WITH FUSION NAVIGATION (N/A)  Anesthesia type: general  Patient location: PACU  Post pain: Pain level controlled  Post assessment: Patient's Cardiovascular Status Stable  Last Vitals:  Filed Vitals:   11/10/11 1300  BP:   Pulse: 115  Temp:   Resp:     Post vital signs: Reviewed and stable  Level of consciousness: sedated  Complications: No apparent anesthesia complications

## 2011-11-10 NOTE — Op Note (Addendum)
11/10/11 12:36 PM Surgeons: Melvenia Beam MD Otolaryngology  Coletta Memos MD Neurosurgery   PREOPERATIVE DIAGNOSIS: 1.bilateral chronic maxillary sinusitis 2. Severe septal deviation 3. Pituitary mass   POSTOPERATIVE DIAGNOSIS: 1.bilateral chronic maxillary sinusitis 2. Severe septal deviation 3. Pituitary mass  Procedures Performed: 16109-60 excision of pituitary tumor, transnasal/transsphenoidal approach 30520-endoscopic septoplasty [phone] bilateral maxillary antrostomies with tissue removal 31255-50 bilateral total ethmoidectomies 19147-82 bilateral sphenoidotomies with tissue removal 61782-stereotactic image guidance  ANESTHESIA: General endotracheal.  ESTIMATED BLOOD LOSS: Approximately 700 cc.  HISTORY OF PRESENT ILLNESS: The patient is a  31yo female with an enlarging pituitary mass with concommitant chronic maxillary sinusitis and a severe right septal deviation causing nasal obstruction.  OPERATIVE FINDINGS: gross total pituitary tumor resection. Bilateral gross purulence in the maxillary sinuses, right maxillary sinus cultures for aerobes and gram stain. Severe right sided septal deviation requiring septoplasty. Small inferior Sellar CSF leak repaired with right middle turbinate mucosal graft, surgicel, and durapair.  PROCEDURE: The patient was brought to the operating room and placed in the supine position. After adequate endotracheal anesthesia was obtained, the abdomen was prepped and draped in sterile fashion. Lidocaine 1% with 1:100,000 epinephrine was injected into the region of the anterior portion of the nasal septum and the bilateral greater palatine foramina in the standard fashion. The patient was registered to the Medtronic Fusion image guidance system to the image guidance protocol MRI with surface matching registration with good precision and accuracy.  The nose was decongested with topical Afrin pledgets which were then removed. The nose was inspected with  the 0 degree endoscope. The septum was noted to be severely deviated to the right precluding endoscopic access to the sphenoethmoid recess or the right sphenoid sinus, so the decision was made to perform an endoscopic septoplasty. A #15 blade was used to make a standard right sided Killian incision. A right submucoperichondrial and submucoperiosteal flap was carefully elevated and then an offset vertical cartilaginous incision was made wit hthe Cottle. A left sided submucoperichondrial and submucoperiosteal flap was then elevated on the left side. The deviated portions of the bony and cartilaginous septum were removed using the straight Blakesley forceps. Once the septum was midline and the sphenoethmoid recess could be easily seen bilaterally, the septal pocket was suctioned out and the septal flaps were reapproximated using a through and through 4-0 chromic gut suture. Of note the patient's mucosa bled briskly during the procedure.  Attention then was directed toward the right side. Lidocaine 1% with 1:100,000 epinephrine was injected in the region of the right middle turbinate and right uncinate process. The right middle turbinate was removed using the Thru-cutting forceps and passed off and placed in saline. The uncinate process was removed systematically superiorly to inferiorly with back-biting forceps and the microdebrider. Copious purulence was noted in the right maxillary sinus which was cultured and then irrigated out. The maxillary natural ostium was identified and opened using the backbiter. The antrostomy was opened widely using the microdebrider. The mucosa was edematous.  Next the right anterior and posterior ethmoid air cells were identified using the image guidance suction and entered primarily and dissected with the microdebrider and the straight and upbiting Blakesley forceps up to the ethmoid roof and out to the lamina.bleeding from the pedicle of the right middle turbinate was cauterized  using the  the suction Bovie.   Next the image guidance suction was used to identify the right sphenoid natural ostium. I opened this using the 2 and 3mm Kerrisons and the microdebrider all  the way up to the skull base. I cauterized the right posterior septal branch of the sphenopalatine artery using the suction Bovie.   Attention then was directed toward the left side. Lidocaine 1% with 1:100,000 epinephrine was injected in the region of the left middle turbinate and left uncinate process. The left middle turbinate was medialized using the [place]. The left uncinate process was removed systematically superiorly to inferiorly with back-biting forceps and the microdebrider. Copious purulence was noted in the left maxillary sinus which was irrigated out. The maxillary natural ostium was identified and opened widely using the backbiter. The left maxillary antrostomy was opened widely using the microdebrider. The mucosa was edematous and the debrider was used to decompress a left maxillary mucous retention cyst.  Next the left anterior and posterior ethmoid air cells were identified using the image guidance suction and entered primarily and dissected with the microdebrider and the straight and upbiting Blakesley forceps up to the ethmoid roof and out to the lamina. No injury to the ethmoid skull base, cribriform, or lamina papyracea/orbits was noted.  Next the image guidance suction was used to identify the left sphenoid natural ostium. I opened this using the 2 and 3mm Kerrisons and the microdebrider all the way up to the skull base. I then made a posterior septectomy using the backbiter, and I took down the sphenoid intersinus septum using the straight Blakesley and  downbiting 3mm Kerrison, and I removed the sphenoid mucosa using the Lenoria Chime. Once the sphenoid sinuses were connected and widely opened bilaterally, I irrigated out the sphenoids and suctioned them out.  At this point I held the scope through  the right nasal cavity to allow visualization of the sella and pituitary by Dr. Franky Macho. Dr. Franky Macho came in an placed the image guidance suction through the right sphenoid. He opened the sella by "eggshelling" the sellar bone off of the sellar dura in the midline, using the image guidance suction to confirm the position of the midline pituitary cystic mass.  Dr. Franky Macho then opened the sellar dura directly over the cystic mass using the 11 blade and dissected the pituitary parenchyma until the pituitary mass was identified. The mass appeared to contain gelatinous material. Dr. Franky Macho used the image guidance suction and various instruments to remove the mass and send tissue off for pathology. Once gross total resection of the mass was noted, the sella was irrigated out. No obvious residual tumor was noted in the cavity and normal pituitary was noted superiorly.  After the sella and sphenoid were irrigated out, I noted a small, low-flow CSF from the left inferior aspect of the dural opening. I removed the mucosa from the right middle turbinate and placed the mucosal graft over the leak and the exposed sellar dura with >100% coverage of the defect. I then bolstered the mucosal graft repair with surgicel followed by Duraseal. No additional CSF extravasation was noted. I then suctioned out the sinus cavities and placed bilateral 8cm middle meatal merocele packs which were secured to the patient's face with silk ties, benzoin, and steristrips.  The stomach was suctioned out using a flexible suction catheter. The patient was returned to the care of anesthesia, awakened from anesthesia, and taken to the recovery area in good condition. The patient tolerated the procedure well with no immediate complications.   Dr. Melvenia Beam was present and participated for the entire procedure. 11/10/11 12:36 PM Melvenia Beam

## 2011-11-10 NOTE — Progress Notes (Signed)
Patient ID: Kristina Carpenter, female   DOB: December 18, 1980, 31 y.o.   MRN: [id number] BP 117/68  Pulse 107  Temp 98.4 F (36.9 C) (Oral)  Resp 22  Ht 5\' 2"  (1.575 m)  Wt 97.9 kg (215 lb 13.3 oz)  BMI 39.48 kg/m2  SpO2 97%  LMP 10/28/2011 Alert and oriented x 4  Speech clear and fluent Perrl, full eom, full visual fields Moving all extremities well

## 2011-11-10 NOTE — Op Note (Signed)
11/10/2011  11:59 AM  PATIENT:  Kristina Carpenter  31 y.o. female  PRE-OPERATIVE DIAGNOSIS:  pituitary tumor  POST-OPERATIVE DIAGNOSIS:  pituitary tumor  PROCEDURE:  Procedure(s): CRANIOTOMY HYPOPHYSECTOMY TRANSNASAL APPROACH ENDOSCOPIC SINUS SURGERY WITH FUSION NAVIGATION  SURGEON:  Surgeon(s): Carmela Hurt, MD Melvenia Beam, MD  ASSISTANTS:none  ANESTHESIA:   general  EBL:  Total I/O In: 2000 [I.V.:2000] Out: 1100 [Urine:400; Blood:700]  BLOOD ADMINISTERED:none  CELL SAVER GIVEN:none  COUNT:per nursing  DRAINS: none   SPECIMEN:  Source of Specimen:  pituitary gland  DICTATION: Under separate cover Dr. Emeline Darling will dictate both the opening and closure. After gaining access to the sphenoid sinus using stereotactic guided instruments I removed the bone overlying the sella. I then opened the dura covering the pituitary gland, indicated by the stealth system to be the location of the pituitary lesion. I then used a dissector while Dr. Emeline Darling guided the endoscope to allow for visualization of the gland. I found what was gelatinous material which was distinct from what Appeared to be normal gland. I did send some to pathology. I dissected in the interior of the gland and using the stereotactic guidance I believed that I had emptied the material within gland. I irrigated copiously to clear my field and to free any loose material. I was satisfied with the resection with both scope and stealth. I then left the case for Dr. Emeline Darling to finish.   PLAN OF CARE: Admit to inpatient   PATIENT DISPOSITION:  PACU - hemodynamically stable.   Delay start of Pharmacological VTE agent (>24hrs) due to surgical blood loss or risk of bleeding:  yes

## 2011-11-10 NOTE — Anesthesia Procedure Notes (Signed)
Procedure Name: Intubation Date/Time: 11/10/2011 8:16 AM Performed by: Gayla Medicus Pre-anesthesia Checklist: Patient identified, Timeout performed, Emergency Drugs available, Suction available and Patient being monitored Patient Re-evaluated:Patient Re-evaluated prior to inductionOxygen Delivery Method: Circle system utilized Preoxygenation: Pre-oxygenation with 100% oxygen Intubation Type: IV induction Ventilation: Mask ventilation without difficulty and Oral airway inserted - appropriate to patient size Laryngoscope Size: Mac and 3 Grade View: Grade II Tube type: Oral Tube size: 7.5 mm Number of attempts: 1 Airway Equipment and Method: Stylet Placement Confirmation: ETT inserted through vocal cords under direct vision,  breath sounds checked- equal and bilateral and positive ETCO2 Secured at: 23 cm Tube secured with: Tape Dental Injury: Teeth and Oropharynx as per pre-operative assessment

## 2011-11-10 NOTE — Transfer of Care (Signed)
Immediate Anesthesia Transfer of Care Note  Patient: Kristina Carpenter  Procedure(s) Performed: Procedure(s) (LRB) with comments: CRANIOTOMY HYPOPHYSECTOMY TRANSNASAL APPROACH (N/A) - transphenoidal resection of pituitary tumor  ENDOSCOPIC SINUS SURGERY WITH FUSION NAVIGATION (N/A)  Patient Location: PACU  Anesthesia Type: General  Level of Consciousness: awake and alert   Airway & Oxygen Therapy: Patient Spontanous Breathing and Patient connected to face mask oxygen  Post-op Assessment: Report given to PACU RN, Post -op Vital signs reviewed and stable and Patient moving all extremities X 4  Post vital signs: Reviewed and stable  Complications: No apparent anesthesia complications

## 2011-11-10 NOTE — H&P (Addendum)
  BP 147/92  Pulse 98  Temp 98.5 F (36.9 C) (Oral)  Resp 18  SpO2 99%  LMP 10/28/2011 Cc: Pituitary tumor Kristina Carpenter presents with a pituitary mass. Given its size and location I recommended resection of the mass. This will be done in conjunction with Dr. Emeline Darling of ENT. Kristina Carpenter has had this mass since at least 2009. She has had no hormonal abnormalities. A reported workup for cushing's disease was negative. She has had no visual field problems. Past Medical History  Diagnosis Date  . Headache   . Pituitary neoplasm   . Hypertension   . Depression   . Anxiety   . H/O pyelonephritis   . Frequent UTI   . Irritable bowel syndrome with diarrhea   . Family history of anesthesia complication     pt is adopted  . Poor dentition   Peripheral neuropathy Past Surgical History  Procedure Date  . No past surgeries    Family History  Problem Relation Age of Onset  . Adopted: Yes   History   Social History  . Marital Status: Legally Separated    Spouse Name: N/A    Number of Children: N/A  . Years of Education: N/A   Occupational History  . Not on file.   Social History Main Topics  . Smoking status: Current Every Day Smoker -- 1.0 packs/day for 16 years    Types: Cigarettes  . Smokeless tobacco: Never Used  . Alcohol Use: No  . Drug Use: No  . Sexually Active: Not Currently   Other Topics Concern  . Not on file   Social History Narrative  . No narrative on file   ROS: Tinnutus, nasal congestion, hypertension, anxiety, depression, and exessive thirst.  She denies constitutional, eye, respiratory, GI, GU, musculoskeletal, skin, neurological, hematologic and allergic problems. No Known Allergies Alert and oriented x 4 Speech clear and fluent Perrl, full eom Tongue and uvula midline 5/5 strength upper and lower extremities Normal muscle tone bulk and coordination Markedly decreased proprioception at the left great toe, decreased at the right great toe, present in the  upper extremities. Decreased light touch in both legs. Pin prick present proximal to the knee, not distal. AP Or for tumor resection. Risks including stroke, coma, death, inability to resect the tumor,damage to the optic nerves, damage to the brain, blindness, need for further surgery were discussed. She understands and wishes to proceed.

## 2011-11-11 LAB — BASIC METABOLIC PANEL
BUN: 4 mg/dL — ABNORMAL LOW (ref 6–23)
CO2: 31 mEq/L (ref 19–32)
Calcium: 8.7 mg/dL (ref 8.4–10.5)
Chloride: 101 mEq/L (ref 96–112)
Creatinine, Ser: 0.44 mg/dL — ABNORMAL LOW (ref 0.50–1.10)
GFR calc Af Amer: >90 mL/min (ref >90)
GFR calc non Af Amer: >90 mL/min (ref >90)
Glucose, Bld: 108 mg/dL — ABNORMAL HIGH (ref 70–99)
Potassium: 3.3 mEq/L — ABNORMAL LOW (ref 3.5–5.1)
Sodium: 142 mEq/L (ref 135–145)

## 2011-11-11 MED ORDER — ENSURE COMPLETE PO LIQD
237.0000 mL | Freq: Two times a day (BID) | ORAL | Status: DC
  Administered 2011-11-12 – 2011-11-16 (×7): 237 mL via ORAL

## 2011-11-11 MED ORDER — NICOTINE 14 MG/24HR TD PT24
14.0000 mg | MEDICATED_PATCH | TRANSDERMAL | Status: DC
  Administered 2011-11-11 – 2011-11-15 (×5): 14 mg via TRANSDERMAL
  Filled 2011-11-11 (×8): qty 1

## 2011-11-11 NOTE — Progress Notes (Signed)
Urine specific gravities were as follows for 11/11/11:  0900-1.029 1100-1.029 1300-1.023 1500-1.01 17001.016

## 2011-11-11 NOTE — Progress Notes (Signed)
Pt tolerating POs well. Requested regular diet for dinner.

## 2011-11-11 NOTE — Progress Notes (Signed)
Subjective: Patient resting in bed, reasonably comfortable. Complaining of thirst, drinking actively.  Objective: Vital signs in last 24 hours: Filed Vitals:   11/11/11 0500 11/11/11 0600 11/11/11 0700 11/11/11 0800  BP: 104/52 110/65 111/60 110/68  Pulse: 107 101 102 96  Temp:      TempSrc:      Resp: 19 19 17 18   Height:      Weight:      SpO2: 89% 98% 90% 99%    Intake/Output from previous day: 09/27 0701 - 09/28 0700 In: 2915 [I.V.:2600; IV Piggyback:215] Out: 3150 [Urine:2450; Blood:700] Intake/Output this shift: Total I/O In: -  Out: 60 [Urine:60]  Physical Exam: Patient is awake and alert, following commands.   CBC  Basename 11/10/11 1400 11/10/11 1026  WBC -- --  HGB 11.7* 11.9*  HCT 35.5* 35.0*  PLT -- --   BMET  Basename 11/10/11 1300 11/10/11 1026  NA 142 140  K 3.5 2.8*  CL 105 --  CO2 28 --  GLUCOSE 152* 154*  BUN 9 --  CREATININE 0.51 --  CALCIUM 7.6* --    Assessment/Plan: Will check BMET now and in a.m.  We'll check specific gravity every 2 hours. We'll leave Foley in so as to closely monitor input and output. We'll begin out of bed to chair and progress to ambulation as tolerated. Diet to advance as tolerated.   Hewitt Shorts, MD 11/11/2011, 8:53 AM
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
## 2011-11-12 LAB — CULTURE, ROUTINE-SINUS

## 2011-11-12 LAB — BASIC METABOLIC PANEL
BUN: 7 mg/dL (ref 6–23)
CO2: 36 mEq/L — ABNORMAL HIGH (ref 19–32)
Calcium: 8.9 mg/dL (ref 8.4–10.5)
Chloride: 101 mEq/L (ref 96–112)
Creatinine, Ser: 0.5 mg/dL (ref 0.50–1.10)
GFR calc Af Amer: >90 mL/min (ref >90)
GFR calc non Af Amer: >90 mL/min (ref >90)
Glucose, Bld: 102 mg/dL — ABNORMAL HIGH (ref 70–99)
Potassium: 3.2 mEq/L — ABNORMAL LOW (ref 3.5–5.1)
Sodium: 143 mEq/L (ref 135–145)

## 2011-11-12 MED ORDER — PANTOPRAZOLE SODIUM 40 MG PO TBEC
40.0000 mg | DELAYED_RELEASE_TABLET | Freq: Every day | ORAL | Status: DC
  Administered 2011-11-12 – 2011-11-15 (×4): 40 mg via ORAL
  Filled 2011-11-12 (×4): qty 1

## 2011-11-12 MED ORDER — OXYCODONE-ACETAMINOPHEN 5-325 MG PO TABS
1.0000 | ORAL_TABLET | ORAL | Status: DC | PRN
  Administered 2011-11-12 – 2011-11-13 (×7): 2 via ORAL
  Filled 2011-11-12 (×7): qty 2

## 2011-11-12 MED ORDER — MORPHINE SULFATE 4 MG/ML IJ SOLN
INTRAMUSCULAR | Status: AC
  Filled 2011-11-12: qty 1

## 2011-11-12 NOTE — Progress Notes (Signed)
PHARMACIST - PHYSICIAN COMMUNICATION  CONCERNING: Protonix IV to Oral Route Change Policy  RECOMMENDATION: This patient is receiving Protonix by the intravenous route.  Based on criteria approved by the Pharmacy and Therapeutics Committee, this is being converted to the equivalent oral dose form(s).   DESCRIPTION: These criteria include:  The patient is not neutropenic and does not exhibit a GI malabsorption state. Did not have GIB this admit.  The patient is eating (either orally or via tube) and/or has been taking other orally administered medications for a least 24 hours  If you have questions about this conversion, please contact the Pharmacy Department  Keidy Thurgood K. Allena Katz, PharmD, BCPS.  Clinical Pharmacist Pager [phone]. 11/12/2011 11:45 AM

## 2011-11-12 NOTE — Progress Notes (Signed)
Subjective: Patient with mild headache, mild thirst, keeping up with by mouth intake. BMET shows stable sodium, urine output has been stable.   Objective: Vital signs in last 24 hours: Filed Vitals:   11/12/11 0400 11/12/11 0500 11/12/11 0600 11/12/11 0700  BP: 140/91 135/85 134/87 151/104  Pulse: 83 80 74 68  Temp: 97.7 F (36.5 C)     TempSrc: Oral     Resp: 25  13 14   Height:      Weight:      SpO2: 97% 90% 97% 97%    Intake/Output from previous day: 09/28 0701 - 09/29 0700 In: 460 [P.O.:180; IV Piggyback:280] Out: 1520 [Urine:1520] Intake/Output this shift:    Physical Exam:  Awake and alert, oriented, following commands. Moving all 4 extremity is well.  CBC  Basename 11/10/11 1400 11/10/11 1026  WBC -- --  HGB 11.7* 11.9*  HCT 35.5* 35.0*  PLT -- --   BMET  Basename 11/12/11 0550 11/11/11 0920  NA 143 142  K 3.2* 3.3*  CL 101 101  CO2 36* 31  GLUCOSE 102* 108*  BUN 7 4*  CREATININE 0.50 0.44*  CALCIUM 8.9 8.7    Assessment/Plan: Stable following transsphenoidal resection of pituitary tumor by Dr. Franky Macho. We'll recheck labs in a.m.   Hewitt Shorts, MD 11/12/2011, 8:22 AM

## 2011-11-13 ENCOUNTER — Encounter (HOSPITAL_COMMUNITY): Payer: Self-pay | Admitting: Neurosurgery

## 2011-11-13 LAB — BASIC METABOLIC PANEL
BUN: 8 mg/dL (ref 6–23)
CO2: 36 mEq/L — ABNORMAL HIGH (ref 19–32)
Calcium: 9.1 mg/dL (ref 8.4–10.5)
Chloride: 98 mEq/L (ref 96–112)
Creatinine, Ser: 0.49 mg/dL — ABNORMAL LOW (ref 0.50–1.10)
GFR calc Af Amer: >90 mL/min (ref >90)
GFR calc non Af Amer: >90 mL/min (ref >90)
Glucose, Bld: 91 mg/dL (ref 70–99)
Potassium: 2.9 mEq/L — ABNORMAL LOW (ref 3.5–5.1)
Sodium: 142 mEq/L (ref 135–145)

## 2011-11-13 LAB — POCT URINE SPECIFIC GRAVITY, REFRACTOMETER
Specific gravity, refractometer-urine: 1.0144 (ref 1.001–1.035)
Specific gravity, refractometer-urine: 1.0286 (ref 1.001–1.035)

## 2011-11-13 MED ORDER — POTASSIUM CHLORIDE CRYS ER 20 MEQ PO TBCR
40.0000 meq | EXTENDED_RELEASE_TABLET | Freq: Once | ORAL | Status: AC
  Administered 2011-11-13: 40 meq via ORAL
  Filled 2011-11-13: qty 2

## 2011-11-13 MED ORDER — MORPHINE SULFATE 2 MG/ML IJ SOLN
2.0000 mg | INTRAMUSCULAR | Status: DC | PRN
Start: 2011-11-13 — End: 2011-11-14
  Administered 2011-11-13: 2 mg via INTRAVENOUS
  Administered 2011-11-14: 4 mg via INTRAVENOUS
  Administered 2011-11-14: 6 mg via INTRAVENOUS
  Administered 2011-11-14: 2 mg via INTRAVENOUS
  Filled 2011-11-13 (×2): qty 2
  Filled 2011-11-13 (×2): qty 3

## 2011-11-13 NOTE — Progress Notes (Signed)
11/13/2011 @0800  Urine Specific gravity result of 1.0131

## 2011-11-13 NOTE — Progress Notes (Signed)
Patient ID: Kristina Carpenter, female   DOB: [birth year], 31 y.o.   MRN: [id number] BP 131/82  Pulse 72  Temp 98.1 F (36.7 C) (Axillary)  Resp 17  Ht 5\' 2"  (1.575 m)  Wt 97.9 kg (215 lb 13.3 oz)  BMI 39.48 kg/m2  SpO2 95%  LMP 10/28/2011 Alert and oriented x4 Speech clear and fluent Path still not available Perrl, full eom Will dc foley, transfer tomorrow

## 2011-11-14 MED ORDER — CEPHALEXIN 500 MG PO CAPS
500.0000 mg | ORAL_CAPSULE | Freq: Three times a day (TID) | ORAL | Status: DC
  Administered 2011-11-15 – 2011-11-16 (×5): 500 mg via ORAL
  Filled 2011-11-14 (×8): qty 1

## 2011-11-14 MED ORDER — MAGNESIUM CITRATE PO SOLN
1.0000 | Freq: Every day | ORAL | Status: DC | PRN
  Filled 2011-11-14: qty 296

## 2011-11-14 NOTE — Progress Notes (Signed)
Patient ID: Kristina Carpenter, female   DOB: 06/16/1980, 31 y.o.   MRN: [id number] BP 119/84  Pulse 56  Temp 98.5 F (36.9 C) (Oral)  Resp 16  Ht 5\' 2"  (1.575 m)  Wt 97.9 kg (215 lb 13.3 oz)  BMI 39.48 kg/m2  SpO2 94%  LMP 10/28/2011 Alert and oriented x 4 Perrl, full eom No neuro deficits Will transfer to floor

## 2011-11-15 LAB — ANAEROBIC CULTURE

## 2011-11-15 MED ORDER — OXYCODONE HCL 5 MG PO TABS
10.0000 mg | ORAL_TABLET | ORAL | Status: DC | PRN
  Administered 2011-11-15: 5 mg via ORAL
  Administered 2011-11-15 – 2011-11-16 (×3): 10 mg via ORAL
  Filled 2011-11-15 (×3): qty 2

## 2011-11-15 MED FILL — Lidocaine HCl Local Preservative Free (PF) Inj 1%: INTRAMUSCULAR | Qty: 2 | Status: AC

## 2011-11-15 MED FILL — Lidocaine Inj 1% w/ Epinephrine-1:200000 (PF): INTRAMUSCULAR | Qty: 10 | Status: AC

## 2011-11-15 NOTE — Progress Notes (Signed)
Patient ID: Kristina Carpenter, female   DOB: 1981/01/24, 31 y.o.   MRN: [id number] BP 147/89  Pulse 93  Temp 98.5 F (36.9 C) (Oral)  Resp 20  Ht 5\' 2"  (1.575 m)  Wt 97.9 kg (215 lb 13.3 oz)  BMI 39.48 kg/m2  SpO2 100%  LMP 10/28/2011 Alert and oriented x4 Will dc tomorrow Full eom Full visual fields

## 2011-11-16 NOTE — Care Management Note (Signed)
    Page 1 of 1   11/16/2011     1:43:35 PM   CARE MANAGEMENT NOTE 11/16/2011  Patient:  Kristina Carpenter, Kristina Carpenter   Account Number:  [id number]  Date Initiated:  11/10/2011  Documentation initiated by:  Alvira Philips Assessment:   31 yr-old female adm with dx of pituitary mass; lives with family, independent PTA     Action/Plan:   PT/OT evals-no fpollow up needs   Anticipated DC Date:  11/16/2011   Anticipated DC Plan:  HOME/SELF CARE      DC Planning Services  CM consult      Choice offered to / List presented to:             Status of service:  Completed, signed off Medicare Important Message given?   (If response is "NO", the following Medicare IM given date fields will be blank) Date Medicare IM given:   Date Additional Medicare IM given:    Discharge Disposition:  HOME/SELF CARE  Per UR Regulation:  Reviewed for med. necessity/level of care/duration of stay  If discussed at Long Length of Stay Meetings, dates discussed:    Comments:

## 2011-11-16 NOTE — Progress Notes (Signed)
Subjective: POD#6 from transsphenoidal approach to resection of pituitary mass with bilateral functional endoscopic sinus surgery, septoplasty, and repair of small sellar CSF leak with right middle turbinate mucosal graft. Doing well, urine output has been appropriate with normal spec grav's, has expected headache but otherwise doing well.  Objective: Vital signs in last 24 hours: Temp:  [97.9 F (36.6 C)-98.5 F (36.9 C)] 97.9 F (36.6 C) (10/03 0801) Pulse Rate:  [72-93] 83  (10/03 0801) Resp:  [18-20] 20  (10/03 0801) BP: (110-154)/(80-105) 154/98 mmHg (10/03 0801) SpO2:  [93 %-100 %] 93 % (10/03 0801)  Awake, alert, in no acute distress. EOMI, PERRLA, I removed her bilateral nasal packing without difficulty, anterior rhinoscopy shows nose hemostatic with some old blood-tinged mucous and septum midline with normal turbinates. Oral cavity clear, CN 2-12 intact and symmetric bilaterally.  @LABLAST2 (wbc:2,hgb:2,hct:2,plt:2) No results found for this basename: NA:2,K:2,CL:2,CO2:2,GLUCOSE:2,BUN:2,CREATININE:2,CALCIUM:2 in the last 72 hours  Medications:  No current facility-administered medications on file prior to encounter.   Current Outpatient Prescriptions on File Prior to Encounter  Medication Sig Dispense Refill  . buPROPion (WELLBUTRIN XL) 300 MG 24 hr tablet Take 300 mg by mouth daily.      . clonazePAM (KLONOPIN) 1 MG tablet Take 1 mg by mouth 2 (two) times daily as needed. For anxiety      . DULoxetine (CYMBALTA) 30 MG capsule Take 90 mg by mouth daily.      Marland Kitchen gabapentin (NEURONTIN) 300 MG capsule Take 600-900 mg by mouth 3 (three) times daily. 900mg  in the morning 600mg  at lunch 900 at bedtime      . metoprolol succinate (TOPROL-XL) 50 MG 24 hr tablet Take 50 mg by mouth daily. Take with or immediately following a meal.        Assessment/Plan: Doing well POD#6 from transsphenoidal resection of pituitary mass with Dr. Franky Macho with bilateral functional endoscopic sinus  surgery and septoplasty for septal deviation and coagulase negative staph sinusitis. I wrote a prescription for 7 days of Bactrim DS for the coagulase negative staph sinusitis and topical mupirocin to apply to the nostrils TID for 4 weeks. Also recommended no nose blowing, no heavy lifting or strenuous acitivity for 3-4 weeks. Follow up with Dr. Emeline Darling in 2-3 weeks for nasal endoscopy.   LOS: 6 days   Melvenia Beam 11/16/2011, 8:48 AM

## 2011-11-16 NOTE — Progress Notes (Signed)
Pt was given D/C orders with pt repeating her follow up care , S/S of infection any perfused bleeding etc. Pt understood s.She also was told to stop at DR. Cabbell 's office for her pain med rx for home

## 2011-11-17 NOTE — Discharge Summary (Signed)
Physician Discharge Summary  Patient ID: [name] MRN: [id number] DOB/AGE: April 14, 1980 31 y.o.  Admit date: 11/10/2011 Discharge date: 11/17/2011  Admission Diagnoses:Pituitary mass   Discharge Diagnoses: Pituitary mass Active Problems:  * No active hospital problems. *    Discharged Condition: good  Hospital Course: [name] was admitted and taken to the operating room where she underwent a transsphenoidal resection of the pituitary mass. The final pathology was non diagnostic, but there was no doubt that the mass was gelatinous, did not have the consistency of normal pituitary gland, and extruded through the small opening I made in the gland after opening the dura. I will repeat her scan in ~4-6weeks. Neurologically she has had no change, maintaining a normal exam post op. She has no visual field deficits, and hormonally has been doing well. Her nasal packs were removed before she left today.   Consults: ENT  Significant Diagnostic Studies: none  Treatments: surgery: as above  Discharge Exam: Blood pressure 127/85, pulse 95, temperature 97.9 F (36.6 C), temperature source Oral, resp. rate 18, height 5\' 2"  (1.575 m), weight 97.9 kg (215 lb 13.3 oz), last menstrual period 10/28/2011, SpO2 100.00%. General appearance: alert, cooperative, appears stated age and mildly obese Neurologic: Alert and oriented X 3, normal strength and tone. Normal symmetric reflexes. Normal coordination and gait  Disposition: 01-Home or Self Care     Medication List     As of 11/17/2011  7:13 PM    TAKE these medications         buPROPion 300 MG 24 hr tablet   Commonly known as: WELLBUTRIN XL   Take 300 mg by mouth daily.      clonazePAM 1 MG tablet   Commonly known as: KLONOPIN   Take 1 mg by mouth 2 (two) times daily as needed. For anxiety      DULoxetine 30 MG capsule   Commonly known as: CYMBALTA   Take 90 mg by mouth daily.      gabapentin 300 MG capsule   Commonly known as:  NEURONTIN   Take 600-900 mg by mouth 3 (three) times daily. 900mg  in the morning  600mg  at lunch  900 at bedtime      metoprolol succinate 50 MG 24 hr tablet   Commonly known as: TOPROL-XL   Take 50 mg by mouth daily. Take with or immediately following a meal.      multivitamin with minerals Tabs   Take 1 tablet by mouth daily.      oxyCODONE-acetaminophen 5-325 MG per tablet   Commonly known as: PERCOCET/ROXICET   Take 1-2 tablets by mouth every 6 (six) hours as needed. For pain      traMADol 50 MG tablet   Commonly known as: ULTRAM   Take 100 mg by mouth every 8 (eight) hours as needed.           Follow-up Information    Follow up with Marella Vanderpol L, MD. (call to make appt in 3 weeks)    Contact information:   [address] [place] [address] [phone]          Signed: Donoven Pett L 11/17/2011, 7:13 PM

## 2011-12-18 ENCOUNTER — Emergency Department (HOSPITAL_COMMUNITY)
Admission: EM | Admit: 2011-12-18 | Discharge: 2011-12-19 | Disposition: A | Discharge status: Home / Self Care | Payer: Medicaid Other | Attending: Emergency Medicine | Admitting: Emergency Medicine

## 2011-12-18 ENCOUNTER — Emergency Department (HOSPITAL_COMMUNITY): Payer: Medicaid Other

## 2011-12-18 ENCOUNTER — Encounter (HOSPITAL_COMMUNITY): Payer: Self-pay | Admitting: Emergency Medicine

## 2011-12-18 DIAGNOSIS — I10 Essential (primary) hypertension: Secondary | ICD-10-CM | POA: Insufficient documentation

## 2011-12-18 DIAGNOSIS — F17200 Nicotine dependence, unspecified, uncomplicated: Secondary | ICD-10-CM | POA: Insufficient documentation

## 2011-12-18 DIAGNOSIS — R110 Nausea: Secondary | ICD-10-CM | POA: Insufficient documentation

## 2011-12-18 DIAGNOSIS — F411 Generalized anxiety disorder: Secondary | ICD-10-CM | POA: Insufficient documentation

## 2011-12-18 DIAGNOSIS — Z79899 Other long term (current) drug therapy: Secondary | ICD-10-CM | POA: Insufficient documentation

## 2011-12-18 DIAGNOSIS — F329 Major depressive disorder, single episode, unspecified: Secondary | ICD-10-CM | POA: Insufficient documentation

## 2011-12-18 DIAGNOSIS — R0789 Other chest pain: Secondary | ICD-10-CM | POA: Insufficient documentation

## 2011-12-18 DIAGNOSIS — R51 Headache: Secondary | ICD-10-CM

## 2011-12-18 DIAGNOSIS — Z87440 Personal history of urinary (tract) infections: Secondary | ICD-10-CM | POA: Insufficient documentation

## 2011-12-18 DIAGNOSIS — K589 Irritable bowel syndrome without diarrhea: Secondary | ICD-10-CM | POA: Insufficient documentation

## 2011-12-18 DIAGNOSIS — R209 Unspecified disturbances of skin sensation: Secondary | ICD-10-CM | POA: Insufficient documentation

## 2011-12-18 DIAGNOSIS — F3289 Other specified depressive episodes: Secondary | ICD-10-CM | POA: Insufficient documentation

## 2011-12-18 LAB — COMPREHENSIVE METABOLIC PANEL
ALT: 7 U/L (ref 0–35)
AST: 12 U/L (ref 0–37)
Alkaline Phosphatase: 86 U/L (ref 39–117)
GFR calc Af Amer: >90 mL/min (ref >90)
Glucose, Bld: 101 mg/dL — ABNORMAL HIGH (ref 70–99)
Potassium: 3 mEq/L — ABNORMAL LOW (ref 3.5–5.1)
Sodium: 142 mEq/L (ref 135–145)
Total Protein: 7.9 g/dL (ref 6.0–8.3)

## 2011-12-18 LAB — CBC WITH DIFFERENTIAL/PLATELET
Eosinophils Absolute: 0.2 10*3/uL (ref 0.0–0.7)
Lymphocytes Relative: 19 % (ref 12–46)
Lymphs Abs: 3 10*3/uL (ref 0.7–4.0)
MCH: 28.4 pg (ref 26.0–34.0)
Neutrophils Relative %: 74 % (ref 43–77)
Platelets: 466 10*3/uL — ABNORMAL HIGH (ref 150–400)
RBC: 4.76 MIL/uL (ref 3.87–5.11)
WBC: 16.2 10*3/uL — ABNORMAL HIGH (ref 4.0–10.5)

## 2011-12-18 MED ORDER — DIPHENHYDRAMINE HCL 50 MG/ML IJ SOLN
12.5000 mg | Freq: Once | INTRAMUSCULAR | Status: AC
  Administered 2011-12-18: 12.5 mg via INTRAVENOUS
  Filled 2011-12-18: qty 1

## 2011-12-18 MED ORDER — METOCLOPRAMIDE HCL 5 MG/ML IJ SOLN
10.0000 mg | Freq: Once | INTRAMUSCULAR | Status: AC
  Administered 2011-12-18: 10 mg via INTRAVENOUS
  Filled 2011-12-18: qty 2

## 2011-12-18 MED ORDER — KETOROLAC TROMETHAMINE 30 MG/ML IJ SOLN
30.0000 mg | Freq: Once | INTRAMUSCULAR | Status: AC
  Administered 2011-12-18: 30 mg via INTRAVENOUS
  Filled 2011-12-18: qty 1

## 2011-12-18 MED ORDER — HYDROMORPHONE HCL PF 1 MG/ML IJ SOLN
1.0000 mg | Freq: Once | INTRAMUSCULAR | Status: AC
  Administered 2011-12-18: 1 mg via INTRAVENOUS
  Filled 2011-12-18: qty 1

## 2011-12-18 MED ORDER — SODIUM CHLORIDE 0.9 % IV SOLN
Freq: Once | INTRAVENOUS | Status: AC
  Administered 2011-12-18: 22:00:00 via INTRAVENOUS

## 2011-12-18 NOTE — ED Notes (Signed)
Received bedside report from [facility], [place].  Patient currently sitting up in bed watching television and drinking a soda; no respiratory or acute distress noted.  Patient updated on plan of care; informed patient that we are currently waiting on further orders from EDP/FNP.  Patient reports that her pain has improved; denies any needs at this time.  Will continue to monitor.

## 2011-12-18 NOTE — ED Notes (Signed)
PT. REPORTS CHEST TIGHTNESS AND BILATERAL ARM TINGLING/NUMBNESS WITH SLIGHT NAUSEA ONSET , DENIES SOB OR DIAPHORESIS.

## 2011-12-18 NOTE — ED Provider Notes (Signed)
History     CSN: [id number]  Arrival date & time 12/18/11  1850   First MD Initiated Contact with Patient 12/18/11 1959      Chief Complaint  Patient presents with  . Chest Pain    (Consider location/radiation/quality/duration/timing/severity/associated sxs/prior treatment) HPI Comments: Patient states, that she had a migraine headache for the past several days causing nausea, and an uncomfortable feeling in her chest as well as bilateral arm tingling and numbness.  She denies shortness of breath, diaphoresis, tachycardia, or arrhythmia.  She does, state that she had a benign pituitary tumor removed from her nose.  Approximately 5 weeks, ago  Patient is a 31 y.o. female presenting with chest pain. The history is provided by the patient.  Chest Pain The chest pain began yesterday. Chest pain occurs constantly. The chest pain is unchanged. At its most intense, the pain is at 4/10. The severity of the pain is moderate. Primary symptoms include nausea. Pertinent negatives for primary symptoms include no fever, no shortness of breath, no vomiting and no dizziness.  Associated symptoms include numbness.  Pertinent negatives for associated symptoms include no weakness.     Past Medical History  Diagnosis Date  . Headache   . Pituitary neoplasm   . Hypertension   . Depression   . Anxiety   . H/O pyelonephritis   . Frequent UTI   . Irritable bowel syndrome with diarrhea   . Family history of anesthesia complication     pt is adopted  . Poor dentition     Past Surgical History  Procedure Date  . No past surgeries   . Craniotomy 11/10/2011    Procedure: CRANIOTOMY HYPOPHYSECTOMY TRANSNASAL APPROACH;  Surgeon: Carmela Hurt, MD;  Location: [facility];  Service: Neurosurgery;  Laterality: N/A;  transphenoidal resection of pituitary tumor   . Sinus endo w/fusion 11/10/2011    Procedure: ENDOSCOPIC SINUS SURGERY WITH FUSION NAVIGATION;  Surgeon: Melvenia Beam, MD;  Location: [facility];  Service: ENT;  Laterality: N/A;    Family History  Problem Relation Age of Onset  . Adopted: Yes    History  Substance Use Topics  . Smoking status: Current Every Day Smoker -- 1.0 packs/day for 16 years    Types: Cigarettes  . Smokeless tobacco: Never Used  . Alcohol Use: No    OB History    Grav Para Term Preterm Abortions TAB SAB Ect Mult Living                  Review of Systems  Constitutional: Negative for fever and chills.  Eyes: Negative for visual disturbance.  Respiratory: Positive for chest tightness. Negative for shortness of breath.   Cardiovascular: Negative for leg swelling.  Gastrointestinal: Positive for nausea. Negative for vomiting.  Musculoskeletal: Negative for back pain.  Neurological: Positive for numbness and headaches. Negative for dizziness, facial asymmetry, speech difficulty, weakness and light-headedness.    Allergies  Review of patient's allergies indicates no known allergies.  Home Medications   Current Outpatient Rx  Name  Route  Sig  Dispense  Refill  . ACETAMINOPHEN 500 MG PO TABS   Oral   Take 1,000 mg by mouth every 6 (six) hours as needed. For pain         . BUPROPION HCL ER (XL) 300 MG PO TB24   Oral   Take 300 mg by mouth daily.         . DULOXETINE HCL 30 MG PO CPEP  Oral   Take 90 mg by mouth daily.         Marland Kitchen GABAPENTIN 300 MG PO CAPS   Oral   Take 600-900 mg by mouth 3 (three) times daily. 900mg  in the morning 600mg  at lunch 900 at bedtime         . HYDROCODONE-ACETAMINOPHEN 10-325 MG PO TABS   Oral   Take 1 tablet by mouth every 6 (six) hours as needed. For pain         . IBUPROFEN 200 MG PO TABS   Oral   Take 800 mg by mouth every 6 (six) hours as needed. For pain         . METOPROLOL SUCCINATE ER 50 MG PO TB24   Oral   Take 50 mg by mouth daily. Take with or immediately following a meal.         . ADULT MULTIVITAMIN W/MINERALS CH   Oral   Take 1 tablet by mouth daily.         .  TRAMADOL HCL 50 MG PO TABS   Oral   Take 100 mg by mouth every 8 (eight) hours as needed.           BP 142/91  Pulse 103  Resp 18  SpO2 97%  LMP 12/05/2011  Physical Exam  Constitutional: She is oriented to person, place, and time. She appears well-developed and well-nourished.  HENT:  Head: Normocephalic.  Eyes: Pupils are equal, round, and reactive to light.  Neck: Normal range of motion.  Cardiovascular: Tachycardia present.   Pulmonary/Chest: Effort normal. No respiratory distress. She has no wheezes. She has no rales.  Abdominal: Soft. She exhibits no distension. There is no tenderness.  Musculoskeletal: Normal range of motion. She exhibits no edema and no tenderness.  Neurological: She is alert and oriented to person, place, and time.  Skin: Skin is warm. No rash noted. No pallor.    ED Course  Procedures (including critical care time)  Labs Reviewed  CBC WITH DIFFERENTIAL - Abnormal; Notable for the following:    WBC 16.2 (*)     Platelets 466 (*)     Neutro Abs 12.0 (*)     All other components within normal limits  COMPREHENSIVE METABOLIC PANEL - Abnormal; Notable for the following:    Potassium 3.0 (*)     Glucose, Bld 101 (*)     All other components within normal limits  POCT I-STAT TROPONIN I   Dg Chest 2 View  12/18/2011  *RADIOLOGY REPORT*  Clinical Data: 31 year old female chest pain.  CHEST - 2 VIEW  Comparison: 11/07/2011 and earlier.  Findings: Stable lung volumes. Stable mild cardiomegaly and mediastinal contours.  Visualized tracheal air column is within normal limits.  No pneumothorax, pulmonary edema, pleural effusion or confluent pulmonary opacity. No acute osseous abnormality identified.  IMPRESSION: No acute cardiopulmonary abnormality.   Original Report Authenticated By: Erskine Speed, M.D.      1. Headache    ED ECG REPORT   Date: 12/19/2011  EKG Time: 1:08 AM  Rate: 115  Rhythm: sinus tachycardia,  unchanged from previous tracings   Axis: normal  Intervals:none  ST&T Change: none  Narrative Interpretation: abnormal             MDM   After IV fluids, Reglan, Toradol, and Benadryl.  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  Patient's  pain is improved.  She was given a meal and her headache totally, resolved.  She'll be discharged home to followup with her primary care physician      Arman Filter, NP 12/19/11 [phone]

## 2011-12-18 NOTE — ED Notes (Signed)
Pt st's she started having chest pains around 1600, about the same time she became overly stressed about her BP being elevated.  St's she's been under an abnormal about lately, no cardiac history, no chest pain at the moment.

## 2011-12-19 NOTE — ED Provider Notes (Signed)
Medical screening examination/treatment/procedure(s) were performed by non-physician practitioner and as supervising physician I was immediately available for consultation/collaboration.   Letitia Sabala R Shakir Petrosino, MD 12/19/11 2347 

## 2011-12-19 NOTE — ED Notes (Signed)
Patient given copy of discharge paperwork; went over discharge instructions with patient.  Patient instructed to follow up with primary care physician as needed, and to return to the ED for new, worsening, or concerning symptoms.

## 2011-12-19 NOTE — ED Notes (Signed)
Patient currently resting quietly in bed; no respiratory or acute distress noted.  Patient updated on plan of care; informed patient that we are currently waiting for further orders.  Patient denies any needs at this time; will continue to monitor.

## 2011-12-19 NOTE — ED Notes (Signed)
Patient given something to eat/drink, per FNP request.

## 2013-03-21 ENCOUNTER — Encounter: Payer: Self-pay | Admitting: Neurology

## 2013-03-24 ENCOUNTER — Ambulatory Visit: Payer: Medicaid Other | Admitting: Neurology

## 2013-03-27 ENCOUNTER — Encounter: Payer: Self-pay | Admitting: Neurology

## 2013-03-27 ENCOUNTER — Encounter (INDEPENDENT_AMBULATORY_CARE_PROVIDER_SITE_OTHER): Payer: Self-pay

## 2013-03-27 ENCOUNTER — Ambulatory Visit (INDEPENDENT_AMBULATORY_CARE_PROVIDER_SITE_OTHER): Payer: Medicaid Other | Admitting: Neurology

## 2013-03-27 VITALS — BP 107/69 | HR 90 | Ht 63.5 in | Wt 192.0 lb

## 2013-03-27 DIAGNOSIS — M545 Low back pain, unspecified: Secondary | ICD-10-CM

## 2013-03-27 DIAGNOSIS — R269 Unspecified abnormalities of gait and mobility: Secondary | ICD-10-CM

## 2013-03-27 DIAGNOSIS — R209 Unspecified disturbances of skin sensation: Secondary | ICD-10-CM

## 2013-03-27 DIAGNOSIS — H5310 Unspecified subjective visual disturbances: Secondary | ICD-10-CM

## 2013-03-27 DIAGNOSIS — R42 Dizziness and giddiness: Secondary | ICD-10-CM

## 2013-03-27 HISTORY — DX: Dizziness and giddiness: R42

## 2013-03-27 HISTORY — DX: Low back pain, unspecified: M54.50

## 2013-03-27 HISTORY — DX: Unspecified disturbances of skin sensation: R20.9

## 2013-03-27 HISTORY — DX: Unspecified abnormalities of gait and mobility: R26.9

## 2013-03-27 NOTE — Patient Instructions (Signed)
Back Pain, Adult Low back pain is very common. About 1 in 5 people have back pain.The cause of low back pain is rarely dangerous. The pain often gets better over time.About half of people with a sudden onset of back pain feel better in just 2 weeks. About 8 in 10 people feel better by 6 weeks.  CAUSES Some common causes of back pain include:  Strain of the muscles or ligaments supporting the spine.  Wear and tear (degeneration) of the spinal discs.  Arthritis.  Direct injury to the back. DIAGNOSIS Most of the time, the direct cause of low back pain is not known.However, back pain can be treated effectively even when the exact cause of the pain is unknown.Answering your caregiver's questions about your overall health and symptoms is one of the most accurate ways to make sure the cause of your pain is not dangerous. If your caregiver needs more information, he or she may order lab work or imaging tests (X-rays or MRIs).However, even if imaging tests show changes in your back, this usually does not require surgery. HOME CARE INSTRUCTIONS For many people, back pain returns.Since low back pain is rarely dangerous, it is often a condition that people can learn to manageon their own.   Remain active. It is stressful on the back to sit or stand in one place. Do not sit, drive, or stand in one place for more than 30 minutes at a time. Take short walks on level surfaces as soon as pain allows.Try to increase the length of time you walk each day.  Do not stay in bed.Resting more than 1 or 2 days can delay your recovery.  Do not avoid exercise or work.Your body is made to move.It is not dangerous to be active, even though your back may hurt.Your back will likely heal faster if you return to being active before your pain is gone.  Pay attention to your body when you bend and lift. Many people have less discomfortwhen lifting if they bend their knees, keep the load close to their bodies,and  avoid twisting. Often, the most comfortable positions are those that put less stress on your recovering back.  Find a comfortable position to sleep. Use a firm mattress and lie on your side with your knees slightly bent. If you lie on your back, put a pillow under your knees.  Only take over-the-counter or prescription medicines as directed by your caregiver. Over-the-counter medicines to reduce pain and inflammation are often the most helpful.Your caregiver may prescribe muscle relaxant drugs.These medicines help dull your pain so you can more quickly return to your normal activities and healthy exercise.  Put ice on the injured area.  Put ice in a plastic bag.  Place a towel between your skin and the bag.  Leave the ice on for 15-20 minutes, 03-04 times a day for the first 2 to 3 days. After that, ice and heat may be alternated to reduce pain and spasms.  Ask your caregiver about trying back exercises and gentle massage. This may be of some benefit.  Avoid feeling anxious or stressed.Stress increases muscle tension and can worsen back pain.It is important to recognize when you are anxious or stressed and learn ways to manage it.Exercise is a great option. SEEK MEDICAL CARE IF:  You have pain that is not relieved with rest or medicine.  You have pain that does not improve in 1 week.  You have new symptoms.  You are generally not feeling well. SEEK   IMMEDIATE MEDICAL CARE IF:   You have pain that radiates from your back into your legs.  You develop new bowel or bladder control problems.  You have unusual weakness or numbness in your arms or legs.  You develop nausea or vomiting.  You develop abdominal pain.  You feel faint. Document Released: 01/30/2005 Document Revised: 08/01/2011 Document Reviewed: 06/20/2010 [employer] Patient Information 2014 [employer].  

## 2013-03-27 NOTE — Progress Notes (Signed)
Reason for visit: Numbness, pain  Kristina Carpenter is a 33 y.o. female  History of present illness:  Kristina Carpenter is a 33 year old right-handed white female with a history of problems with neck and low back pain that began about 5 years ago. The patient indicates that she fell down some steps, landed on her buttocks and fractured her coccyx bone. The patient had a second fall several weeks later going forward. The patient has developed numbness and tingly sensations in the hands and in the legs, halfway up the legs. The patient has noted some problems with balance, with falling on occasion. The patient indicates that when she closes her eyes, she has difficulty maintaining her balance. The patient indicates that she has undergone EMG and nerve conduction studies that were done approximately one year ago, and she was told she had pinched nerves in her neck and low back. The patient was seen through a pain center. The patient indicates that she has neck pain without radiation down the arms. Back pain may go down the legs some, on both sides. The patient has some difficulty with the right leg more than the left. The patient has no definite weakness of the extremities. The patient denies problems controlling the bowels or the bladder. Within the last several weeks, the patient has developed dizziness and blurring of vision involving the right eye only. The patient is sent to this office for an evaluation.  Past Medical History  Diagnosis Date  . Headache(784.0)   . 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 Hypertension   . Depression   . Anxiety   . H/O pyelonephritis   . Frequent UTI   . Irritable bowel syndrome with diarrhea   . Family history of anesthesia complication     pt is adopted  . Poor dentition   . Chronic pain syndrome   . Peripheral neuropathy   . Tachycardia   . Lumbago 03/27/2013  . Abnormality of gait 03/27/2013  . Disturbance of skin sensation 03/27/2013    . Dizziness and giddiness 03/27/2013  . Obesity     Past Surgical History  Procedure Laterality Date  . No past surgeries    . Craniotomy  11/10/2011    Procedure: CRANIOTOMY HYPOPHYSECTOMY TRANSNASAL APPROACH;  Surgeon: Winfield Cunas, MD;  Location: [facility];  Service: Neurosurgery;  Laterality: N/A;  transphenoidal resection of pituitary tumor   . Sinus endo w/fusion  11/10/2011    Procedure: ENDOSCOPIC SINUS SURGERY WITH FUSION NAVIGATION;  Surgeon: Ruby Cola, MD;  Location: [facility];  Service: ENT;  Laterality: N/A;    Family History  Problem Relation Age of Onset  . Adopted: Yes    Social history:  reports that she quit smoking about 6 months ago. Her smoking use included Cigarettes. She has a 16 pack-year smoking history. She has never used smokeless tobacco. She reports that she does not drink alcohol or use illicit drugs.  Medications:  Current Outpatient Prescriptions on File Prior to Visit  Medication Sig Dispense Refill  . acetaminophen (TYLENOL) 500 MG tablet Take 1,000 mg by mouth every 6 (six) hours as needed. For pain      . buPROPion (WELLBUTRIN XL) 300 MG 24 hr tablet Take 300 mg by mouth daily.      . DULoxetine (CYMBALTA) 30 MG capsule Take 90 mg by mouth daily.      Marland Kitchen gabapentin (NEURONTIN) 300 MG capsule Take 600-900 mg by  mouth 3 (three) times daily. 900mg  in the morning 600mg  at lunch 900 at bedtime      . HYDROcodone-acetaminophen (NORCO) 10-325 MG per tablet Take 1 tablet by mouth every 6 (six) hours as needed. For pain      . ibuprofen (ADVIL,MOTRIN) 200 MG tablet Take 800 mg by mouth every 6 (six) hours as needed. For pain      . Multiple Vitamin (MULTIVITAMIN WITH MINERALS) TABS Take 1 tablet by mouth daily.      . traMADol (ULTRAM) 50 MG tablet Take 100 mg by mouth every 8 (eight) hours as needed.       No current facility-administered medications on file prior to visit.     No Known Allergies  ROS:  Out of a complete 14 system review  of symptoms, the patient complains only of the following symptoms, and all other reviewed systems are negative.  Blurred vision Headache, numbness, weakness, dizziness Depression, anxiety Insomnia  Blood pressure 107/69, pulse 90, height 5' 3.5" (1.613 m), weight 192 lb (87.091 kg).  Physical Exam  General: The patient is alert and cooperative at the time of the examination. The patient is moderately obese.  Eyes: Pupils are equal, round, and reactive to light. Discs are flat bilaterally.  Neck: The neck is supple, no carotid bruits are noted.  Respiratory: The respiratory examination is clear.  Cardiovascular: The cardiovascular examination reveals a regular rate and rhythm, no obvious murmurs or rubs are noted.  Skin: Extremities are with 1+ edema of ankles bilaterally.  Neurologic Exam  Mental status: The patient is alert and oriented x 3 at the time of the examination. The patient has apparent normal recent and remote memory, with an apparently normal attention span and concentration ability.  Cranial nerves: Facial symmetry is present. There is good sensation of the face to pinprick and soft touch bilaterally. The strength of the facial muscles and the muscles to head turning and shoulder shrug are normal bilaterally. Speech is well enunciated, no aphasia or dysarthria is noted. Extraocular movements are full. Visual fields are full. The tongue is midline, and the patient has symmetric elevation of the soft palate. No obvious hearing deficits are noted.  Motor: The motor testing reveals 5 over 5 strength of all 4 extremities. Good symmetric motor tone is noted throughout.  Sensory: Sensory testing is intact to pinprick, soft touch, vibration sensation, and position sense on all 4 extremities, with the exception that there is a stocking pattern pinprick sensory deficit in the distal one half of the legs below the knees. Vibration sensation and position sensation are impaired in the  feet. No evidence of extinction is noted.  Coordination: Cerebellar testing reveals good finger-nose-finger and heel-to-shin bilaterally.  Gait and station: Gait is normal. Tandem gait is unsteady. Romberg is negative. No drift is seen.  Reflexes: Deep tendon reflexes are symmetric and normal bilaterally. Ankle jerk reflexes are well-maintained bilaterally. Toes are downgoing bilaterally.   MRI brain 10/31/2011:  IMPRESSION:  Cystic lesion in the central to posterior pituitary gland is  unchanged from the prior MRI. This touches the optic chiasm but no  optic compression is identified. 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 Less likely is a hemorrhagic adenoma or craniopharyngioma    Assessment/Plan:  1. Sensory alteration, all 4 extremities  2. Chronic neck and low back pain  3. Onset of dizziness, blurred vision, OD  The patient indicates that she was told she had a neuropathy problem, but her reflexes are  completely normal throughout. The patient has no definite weakness. The patient reports chronic neck and low back pain. The patient has decreased vibration and position sense in the feet. Given the issues of dizziness, blurred vision in the right eye, and sensory alteration in the arms and legs with balance issues, the patient will be evaluated for possible demyelinating disease. The patient will undergo MRI of the brain and cervical spine. The patient will undergo blood work today. Nerve conduction studies will be repeated on all 4 extremities. The patient will followup through this office in 3-4 months.   Jill Alexanders MD 03/27/2013 7:39 PM  [facility] [address] [place], [place] [address]  Phone [phone] Fax [phone]

## 2013-03-31 ENCOUNTER — Telehealth: Payer: Self-pay | Admitting: Radiology

## 2013-03-31 ENCOUNTER — Encounter: Payer: Medicaid Other | Admitting: Radiology

## 2013-03-31 LAB — IFE AND PE, SERUM
ALPHA 1: 0.2 g/dL (ref 0.1–0.4)
ALPHA2 GLOB SERPL ELPH-MCNC: 0.9 g/dL (ref 0.4–1.2)
Albumin SerPl Elph-Mcnc: 4 g/dL (ref 3.2–5.6)
Albumin/Glob SerPl: 1.3 (ref 0.7–2.0)
B-GLOBULIN SERPL ELPH-MCNC: 1.4 g/dL — AB (ref 0.6–1.3)
Gamma Glob SerPl Elph-Mcnc: 0.8 g/dL (ref 0.5–1.6)
Globulin, Total: 3.3 g/dL (ref 2.0–4.5)
IGM (IMMUNOGLOBULIN M), SRM: 137 mg/dL (ref 40–230)
IgA/Immunoglobulin A, Serum: 481 mg/dL — ABNORMAL HIGH (ref 91–414)
IgG (Immunoglobin G), Serum: 1003 mg/dL (ref 700–1600)
TOTAL PROTEIN: 7.3 g/dL (ref 6.0–8.5)

## 2013-03-31 LAB — TSH: TSH: 2.38 u[IU]/mL (ref 0.450–4.500)

## 2013-03-31 LAB — ANGIOTENSIN CONVERTING ENZYME: Angio Convert Enzyme: 24 U/L (ref 14–82)

## 2013-03-31 LAB — COPPER, SERUM: COPPER: 124 ug/dL (ref 72–166)

## 2013-03-31 LAB — VITAMIN B12: Vitamin B-12: 723 pg/mL (ref 211–946)

## 2013-03-31 LAB — ANA W/REFLEX: ANA: NEGATIVE

## 2013-03-31 LAB — RPR: SYPHILIS RPR SCR: NONREACTIVE

## 2013-04-25 ENCOUNTER — Other Ambulatory Visit: Payer: Medicaid Other

## 2013-06-24 ENCOUNTER — Encounter: Payer: Self-pay | Admitting: Neurology

## 2013-09-18 ENCOUNTER — Ambulatory Visit: Payer: Medicaid Other | Admitting: Neurology

## 2013-10-06 ENCOUNTER — Emergency Department: Payer: Self-pay | Admitting: Emergency Medicine

## 2013-12-04 LAB — BASIC METABOLIC PANEL
Anion Gap: 6 — ABNORMAL LOW (ref 7–16)
BUN: 10 mg/dL (ref 7–18)
CALCIUM: 9.3 mg/dL (ref 8.5–10.1)
CO2: 30 mmol/L (ref 21–32)
Chloride: 101 mmol/L (ref 98–107)
Creatinine: 0.82 mg/dL (ref 0.60–1.30)
EGFR (African American): >60
EGFR (Non-African Amer.): >60
Glucose: 167 mg/dL — ABNORMAL HIGH (ref 65–99)
Osmolality: 277 (ref 275–301)
POTASSIUM: 4 mmol/L (ref 3.5–5.1)
Sodium: 137 mmol/L (ref 136–145)

## 2013-12-04 LAB — CBC WITH DIFFERENTIAL/PLATELET
BASOS ABS: 0.1 10*3/uL (ref 0.0–0.1)
BASOS PCT: 0.8 %
EOS ABS: 0.4 10*3/uL (ref 0.0–0.7)
EOS PCT: 2.8 %
HCT: 39.7 % (ref 35.0–47.0)
HGB: 12.5 g/dL (ref 12.0–16.0)
LYMPHS ABS: 2 10*3/uL (ref 1.0–3.6)
Lymphocyte %: 12.6 %
MCH: 29.3 pg (ref 26.0–34.0)
MCHC: 31.5 g/dL — ABNORMAL LOW (ref 32.0–36.0)
MCV: 93 fL (ref 80–100)
MONOS PCT: 4.9 %
Monocyte #: 0.8 x10 3/mm (ref 0.2–0.9)
Neutrophil #: 12.3 10*3/uL — ABNORMAL HIGH (ref 1.4–6.5)
Neutrophil %: 78.9 %
PLATELETS: 497 10*3/uL — AB (ref 150–440)
RBC: 4.28 10*6/uL (ref 3.80–5.20)
RDW: 14.1 % (ref 11.5–14.5)
WBC: 15.6 10*3/uL — ABNORMAL HIGH (ref 3.6–11.0)

## 2013-12-05 ENCOUNTER — Inpatient Hospital Stay: Payer: Self-pay | Admitting: Internal Medicine

## 2013-12-07 LAB — CBC WITH DIFFERENTIAL/PLATELET
BASOS PCT: 0.3 %
Basophil #: 0 10*3/uL (ref 0.0–0.1)
Eosinophil #: 0.5 10*3/uL (ref 0.0–0.7)
Eosinophil %: 3.4 %
HCT: 38.8 % (ref 35.0–47.0)
HGB: 12.6 g/dL (ref 12.0–16.0)
LYMPHS ABS: 4.4 10*3/uL — AB (ref 1.0–3.6)
Lymphocyte %: 28.2 %
MCH: 29.9 pg (ref 26.0–34.0)
MCHC: 32.6 g/dL (ref 32.0–36.0)
MCV: 92 fL (ref 80–100)
MONOS PCT: 5.5 %
Monocyte #: 0.9 x10 3/mm (ref 0.2–0.9)
NEUTROS ABS: 9.8 10*3/uL — AB (ref 1.4–6.5)
Neutrophil %: 62.6 %
Platelet: 506 10*3/uL — ABNORMAL HIGH (ref 150–440)
RBC: 4.23 10*6/uL (ref 3.80–5.20)
RDW: 13.7 % (ref 11.5–14.5)
WBC: 15.6 10*3/uL — ABNORMAL HIGH (ref 3.6–11.0)

## 2013-12-07 LAB — VANCOMYCIN, TROUGH: Vancomycin, Trough: 5 ug/mL — ABNORMAL LOW (ref 10–20)

## 2013-12-08 LAB — CBC WITH DIFFERENTIAL/PLATELET
Basophil #: 0.2 10*3/uL — ABNORMAL HIGH (ref 0.0–0.1)
Basophil %: 1.2 %
EOS ABS: 0.5 10*3/uL (ref 0.0–0.7)
EOS PCT: 3.1 %
HCT: 39.3 % (ref 35.0–47.0)
HGB: 12.8 g/dL (ref 12.0–16.0)
LYMPHS ABS: 3.1 10*3/uL (ref 1.0–3.6)
Lymphocyte %: 21.3 %
MCH: 29.8 pg (ref 26.0–34.0)
MCHC: 32.6 g/dL (ref 32.0–36.0)
MCV: 92 fL (ref 80–100)
MONO ABS: 0.6 x10 3/mm (ref 0.2–0.9)
MONOS PCT: 4 %
NEUTROS ABS: 10.3 10*3/uL — AB (ref 1.4–6.5)
Neutrophil %: 70.4 %
PLATELETS: 457 10*3/uL — AB (ref 150–440)
RBC: 4.29 10*6/uL (ref 3.80–5.20)
RDW: 13.9 % (ref 11.5–14.5)
WBC: 14.6 10*3/uL — ABNORMAL HIGH (ref 3.6–11.0)

## 2013-12-08 LAB — CREATININE, SERUM
Creatinine: 0.82 mg/dL (ref 0.60–1.30)
EGFR (African American): >60
EGFR (Non-African Amer.): >60

## 2013-12-08 LAB — VANCOMYCIN, TROUGH: VANCOMYCIN, TROUGH: 13 ug/mL (ref 10–20)

## 2013-12-10 LAB — CULTURE, BLOOD (SINGLE)

## 2013-12-13 ENCOUNTER — Emergency Department: Payer: Self-pay | Admitting: Emergency Medicine

## 2014-03-15 ENCOUNTER — Emergency Department: Payer: Self-pay | Admitting: Emergency Medicine

## 2014-03-15 LAB — PRO B NATRIURETIC PEPTIDE: B-TYPE NATIURETIC PEPTID: 725 pg/mL — AB (ref 0–125)

## 2014-03-15 LAB — BASIC METABOLIC PANEL
Anion Gap: 10 (ref 7–16)
BUN: 8 mg/dL (ref 7–18)
CALCIUM: 9.8 mg/dL (ref 8.5–10.1)
CO2: 26 mmol/L (ref 21–32)
Chloride: 101 mmol/L (ref 98–107)
Creatinine: 0.72 mg/dL (ref 0.60–1.30)
EGFR (Non-African Amer.): >60
Glucose: 126 mg/dL — ABNORMAL HIGH (ref 65–99)
OSMOLALITY: 274 (ref 275–301)
Potassium: 3.9 mmol/L (ref 3.5–5.1)
SODIUM: 137 mmol/L (ref 136–145)

## 2014-03-15 LAB — CBC
HCT: 51 % — ABNORMAL HIGH (ref 35.0–47.0)
HGB: 17.3 g/dL — AB (ref 12.0–16.0)
MCH: 29.4 pg (ref 26.0–34.0)
MCHC: 33.8 g/dL (ref 32.0–36.0)
MCV: 87 fL (ref 80–100)
Platelet: 555 10*3/uL — ABNORMAL HIGH (ref 150–440)
RBC: 5.88 10*6/uL — AB (ref 3.80–5.20)
RDW: 14.5 % (ref 11.5–14.5)
WBC: 17.3 10*3/uL — AB (ref 3.6–11.0)

## 2014-03-15 LAB — PROTIME-INR
INR: 1
Prothrombin Time: 12.9 secs

## 2014-03-15 LAB — TROPONIN I: Troponin-I: <0.02 ng/mL

## 2014-04-05 ENCOUNTER — Emergency Department: Payer: Self-pay | Admitting: Physician Assistant

## 2014-04-06 ENCOUNTER — Emergency Department: Payer: Self-pay | Admitting: Emergency Medicine

## 2014-05-09 ENCOUNTER — Emergency Department: Payer: Self-pay | Admitting: Emergency Medicine

## 2014-05-09 LAB — COMPREHENSIVE METABOLIC PANEL
ALT: 13 U/L — AB
AST: 21 U/L
Albumin: 4.1 g/dL
Alkaline Phosphatase: 89 U/L
Anion Gap: 12 (ref 7–16)
BUN: 8 mg/dL
Bilirubin,Total: 0.5 mg/dL
CALCIUM: 9.8 mg/dL
CO2: 32 mmol/L
Chloride: 94 mmol/L — ABNORMAL LOW
Creatinine: 0.83 mg/dL
EGFR (African American): >60
EGFR (Non-African Amer.): >60
Glucose: 74 mg/dL
POTASSIUM: 4 mmol/L
Sodium: 138 mmol/L
Total Protein: 7.9 g/dL

## 2014-05-09 LAB — CBC
HCT: 48.3 % — ABNORMAL HIGH (ref 35.0–47.0)
HGB: 15.6 g/dL (ref 12.0–16.0)
MCH: 29 pg (ref 26.0–34.0)
MCHC: 32.2 g/dL (ref 32.0–36.0)
MCV: 90 fL (ref 80–100)
PLATELETS: 523 10*3/uL — AB (ref 150–440)
RBC: 5.37 10*6/uL — AB (ref 3.80–5.20)
RDW: 14.9 % — ABNORMAL HIGH (ref 11.5–14.5)
WBC: 16.5 10*3/uL — ABNORMAL HIGH (ref 3.6–11.0)

## 2014-05-09 LAB — TROPONIN I: Troponin-I: <0.03 ng/mL

## 2014-06-06 NOTE — H&P (Signed)
PATIENT NAME:  Kristina Carpenter, Kristina Carpenter MR#:  [id number] DATE OF BIRTH:  Dec 27, 1980  DATE OF ADMISSION:  12/05/2013  REFERRING PHYSICIAN: Harvest Dark, MD.    PRIMARY CARE PHYSICIAN:   CHIEF COMPLAINT: Leg swelling.  HISTORY OF PRESENT ILLNESS: This is a 34 year old Caucasian female with past medical history of obesity, hypertension, Raynaud's neuropathy presenting with right lower extremity swelling and noticed progressively worsening swelling over the last 10 days total duration. She said that she is on Norvasc from her PCP for Raynaud's symptoms. About 2 weeks ago, however, stopped taking the medications, 10 days ago when the swelling began. This was mostly related to an adverse retraction of her medication. She subsequently developed erythema mainly of the right lower extremity from the ankle to the mid shin with associated warmth. Also described having subjective fevers. Denies any chills. Two days ago she describes essentially a blister forming along the medial aspect of the right great toe, which has been effaced and now is an ulceration.  Denies any further symptomatology at this time.   REVIEW OF SYSTEMS:  CONSTITUTIONAL: Positive for subjective fevers, chills. Denies fatigue, weakness.  EYES: Denies blurred vision, double vision or eye pain.  EARS, NOSE AND THROAT: Denies any tinnitus, ear pain or hearing loss.  RESPIRATORY: Denies coughing or shortness of breath.  CARDIOVASCULAR: Denies chest pain, palpitations, edema.  GASTROINTESTINAL: Denies nausea, vomiting, diarrhea.  CARDIOVASCULAR: Denies chest pain, positive for edema.  GASTROINTESTINAL: Denies nausea, vomiting, diarrhea or abdominal pain.  GENITOURINARY: Denies dysuria, hematuria.  ENDOCRINE: Denies nocturia or thyroid problems.  HEMATOLOGIC: Denies easy bruising or bleeding.  SKIN: Positive for erythematous rash right lower extremity as described above. Denies any further lesions.  MUSCULOSKELETAL: Denies current pain in  neck, back, shoulder, knees, hips or arthritic symptoms.  NEUROLOGIC: Denies paralysis or paresthesias. Positive for peripheral neuropathy as stated above.  PSYCHIATRIC: Denies anxiety or depressive symptoms.   Otherwise, full review of systems performed by me is negative.   PAST MEDICAL HISTORY: Peripheral neuropathy, Raynaud's phenomena, as well as hypertension.   SOCIAL HISTORY: Remote tobacco usage. Denies any alcohol or drug usage.   FAMILY HISTORY: States that she is adopted.   ALLERGIES: RECENTLY LISTED, AMLODIPINE CAUSING A RASH, HOWEVER, SHE IS ON EXFORGE.   HOME MEDICATIONS: She is unsure of the doses. She does not have her home medication list with her.  However, she does take gabapentin 600 mg 2 tablets p.o. t.i.d. and tramadol unknown dosage, hydrocodone and acetaminophen 10 mg/325 mg every 8 hours as needed for pain, Exforge 5/320 mg p.o. q. daily, Bystolic 40 mg p.o. b.i.d.   PHYSICAL EXAMINATION:  VITAL SIGNS: Temperature 98.5, heart rate 94, respirations 18, blood pressure 118/73, saturating 98% on room air. Weight 74.8 kg, BMI 30.2.  GENERAL: Obese Caucasian female, currently in no acute distress.  HEAD: Normocephalic, atraumatic.  EYES: Pupils equal, round, reactive to light. Extraocular muscles intact. No scleral icterus.  MOUTH: Moist mucosal membrane. Dentition intact. No abscess noted.  EARS, NOSE AND THROAT: Clear without exudates. No external lesions.  NECK: Supple. No thyromegaly. No nodules. No JVD.  PULMONARY: Clear to auscultation bilaterally without wheezes, rubs, or rhonchi. No use of accessory muscles. Good respiratory effort.  CHEST: Nontender to palpation.  CARDIOVASCULAR: S1, S2, regular rate and rhythm. No murmurs, rubs, or gallops. 1+ edema of lower extremities bilaterally. Pedal pulses 2+ bilaterally.  GASTROINTESTINAL: Soft, nontender, nondistended. No masses. Positive bowel sounds. No hepatosplenomegaly.  MUSCULOSKELETAL: No swelling.  Positive for  edema as  described above.  No clubbing or cyanosis. Range of motion full in all extremities.   NEUROLOGIC: The cranial nerves II through XII intact. Decreased sensation of lower extremities bilateral on the plantar surface of the feet. Reflexes intact.  SKIN: There is an erythematous rash prominent over the right lower extremity from her ankle to mid shin, which is warm to touch. Otherwise, there is also a 1 x 2 cm ulceration on the medial aspect of the right great toe. Otherwise, no further lesions, rashes, or cyanosis. Skin warm, dry. Turgor intact.  PSYCHIATRIC: Mood and affect within normal limits. The patient is awake alert, oriented x 3. Insight and judgment intact.   LABORATORY DATA: Sodium 137, potassium 4, chloride 101, bicarbonate 30, BUN 10, creatinine 0.82, glucose 167. WBC 15.6, hemoglobin 12.5, platelets of 497,000. Lower extremity ultrasound performed negative for DVT. X-ray of the right great toe reveals essentially normal film.   ASSESSMENT AND PLAN: A 34 year old Caucasian female with a past history of obesity, hypertension, Raynaud's, presenting with further extremity swelling, which has been progressively worsening.  1. Cellulitis of the right lower extremity initial visit. Blood cultures have already been sent and continue on vancomycin. Follow for improvement. Continue her pain medications as required.  2. Hypertension Need further clarification of her home medications as she is uncertain of the doses at this time and the doses that she gave seem to be outside the typical ranges for these medications. We will restart when doses are known.  3. Neuropathy continue, gabapentin.  4. Venous thromboembolism prophylaxis with heparin subcutaneous.   CODE STATUS: The patient is full code.   TIME SPENT: 45 minutes.    ____________________________ Aaron Mose. Kwali Wrinkle, MD dkh:JT D: 12/05/2013 03:09:26 ET T: 12/05/2013 04:23:19 ET JOB#: [id number]  cc: Aaron Mose. Vilda Zollner, MD, <Dictator> Taylon Coole  Woodfin Ganja MD ELECTRONICALLY SIGNED 12/05/2013 20:43

## 2014-06-06 NOTE — Discharge Summary (Signed)
PATIENT NAME:  Kristina Carpenter, Kristina Carpenter MR#:  [id number] DATE OF BIRTH:  May 28, 1980  CHIEF COMPLAINT AT THE TIME OF ADMISSION:  Right leg swelling and redness.   ADMITTING DIAGNOSIS: Right lower extremity cellulitis, initial visit.  DISCHARGE DIAGNOSIS: Right lower extremity cellulitis significantly improved. Discharged home with p.o. Bactrim, and Percocet as needed for pain.   SECONDARY DISCHARGE DIAGNOSES:  1.  Hypertension.  2.  Neuropathy.   CONSULTATIONS: None.   PROCEDURES: None.   BRIEF HISTORY AND PHYSICAL AND HOSPITAL COURSE: The patient is a 34 year old Caucasian female with past medical history of obesity, came into the ED with a chief complaint of right lower extremity swelling, pain, and redness. The patient was just started on Norvasc by primary care physician for Raynaud phenomenon. The patient discontinued taking her medication, amlodipine, and then she noticed swelling and redness in her right lower extremity and thinking that it is a drug reaction. Subsequently, the patient came into the ED for right lower extremity swelling and redness and pain. Diagnosed with a right lower extremity cellulitis and the patient was started on vancomycin and admitted to the hospital. Lower extremity venous Dopplers were negative for DVT.   During the hospital course, IV vancomycin was continued and pain management was provided. She made significant clinical progress and today her had redness, swelling and pain are almost  resolved. The patient started feeling better and felt like going home. The patient is discharged home with p.o. Bactrim for 5 more days and p.o. Percocet to take as needed for pain.   For hypertension, home medications are resumed including Norvasc. The patient was explained that her lower extremity swelling and redness is not from the Norvasc, but from the cellulitis. She verbalized understanding.   Patient needs to continue taking Norvasc regarding her Raynaud phenomenon.   The  patient is to continue Zanaflex and Neurontin regarding neuropathy.   Otherwise, hospital course was uneventful and white count is trending and it was at 14,000 today.   LABORATORY DATA: AND IMAGING STUDIES: White count is at 14.6 today. Hemoglobin and hematocrit are normal. Platelets 457,000. Blood cultures showed no growth. X-ray of the right foot great toe, negative radiographic evidence of abnormalities in right great toe. Bilateral lower extremity venous Dopplers, negative DVT.   MEDICATIONS AT THE TIME OF DISCHARGE: Bactrim double strength 1 tablet p.o. 2 times a day for 5 days,  Percocet 10/325 one tablet p.o. every 8 hours as needed, dispensed 12 with no refills, Ultram 50 mg 1 tablet p.o. q. 6-8 hours as needed for pain, Zanaflex 4 mg 2 capsules p.o. 3 times a day, Bystolic 40 mg p.o. 2 times a day, Exforge, which is a combination medication with Norvasc, 5 mg/320, one tablet p.o. once daily, gabapentin 600 mg 2 tablets p.o. every 8 hours.   ACTIVITY: As tolerated.  DIET:   Low-sodium diet.  FOLLOWUP:  With primary care physician in 1-2 weeks.   Plan of care was discussed in detail with the patient. She is aware of the plan.   TOTAL TIME SPENT:  45 minutes.   ____________________________ Nicholes Mango, MD ag:LT D: 12/08/2013 14:45:33 ET T: 12/08/2013 22:35:32 ET JOB#: [id number]  cc: Nicholes Mango, MD, <Dictator> Nicholes Mango MD ELECTRONICALLY SIGNED 12/22/2013 21:08

## 2014-07-28 ENCOUNTER — Encounter: Payer: Self-pay | Admitting: Emergency Medicine

## 2014-07-28 ENCOUNTER — Emergency Department
Admission: EM | Admit: 2014-07-28 | Discharge: 2014-07-28 | Disposition: A | Discharge status: Home / Self Care | Payer: Medicaid Other | Attending: Emergency Medicine | Admitting: Emergency Medicine

## 2014-07-28 DIAGNOSIS — Z87891 Personal history of nicotine dependence: Secondary | ICD-10-CM | POA: Insufficient documentation

## 2014-07-28 DIAGNOSIS — L03311 Cellulitis of abdominal wall: Secondary | ICD-10-CM | POA: Diagnosis not present

## 2014-07-28 DIAGNOSIS — R000 Tachycardia, unspecified: Secondary | ICD-10-CM | POA: Diagnosis not present

## 2014-07-28 DIAGNOSIS — R2243 Localized swelling, mass and lump, lower limb, bilateral: Secondary | ICD-10-CM | POA: Diagnosis not present

## 2014-07-28 DIAGNOSIS — G629 Polyneuropathy, unspecified: Secondary | ICD-10-CM | POA: Insufficient documentation

## 2014-07-28 DIAGNOSIS — G8929 Other chronic pain: Secondary | ICD-10-CM | POA: Insufficient documentation

## 2014-07-28 DIAGNOSIS — Z79899 Other long term (current) drug therapy: Secondary | ICD-10-CM | POA: Diagnosis not present

## 2014-07-28 DIAGNOSIS — I10 Essential (primary) hypertension: Secondary | ICD-10-CM | POA: Insufficient documentation

## 2014-07-28 DIAGNOSIS — L539 Erythematous condition, unspecified: Secondary | ICD-10-CM | POA: Diagnosis present

## 2014-07-28 LAB — CBC WITH DIFFERENTIAL/PLATELET
Basophils Absolute: 0.1 10*3/uL (ref 0–0.1)
Basophils Relative: 1 %
Eosinophils Absolute: 0.3 10*3/uL (ref 0–0.7)
Eosinophils Relative: 3 %
HEMATOCRIT: 41.8 % (ref 35.0–47.0)
HEMOGLOBIN: 13.9 g/dL (ref 12.0–16.0)
LYMPHS PCT: 12 %
Lymphs Abs: 1.4 10*3/uL (ref 1.0–3.6)
MCH: 30.2 pg (ref 26.0–34.0)
MCHC: 33.2 g/dL (ref 32.0–36.0)
MCV: 90.9 fL (ref 80.0–100.0)
MONO ABS: 0.5 10*3/uL (ref 0.2–0.9)
Monocytes Relative: 5 %
Neutro Abs: 9.3 10*3/uL — ABNORMAL HIGH (ref 1.4–6.5)
Neutrophils Relative %: 79 %
Platelets: 375 10*3/uL (ref 150–440)
RBC: 4.6 MIL/uL (ref 3.80–5.20)
RDW: 14.8 % — ABNORMAL HIGH (ref 11.5–14.5)
WBC: 11.6 10*3/uL — ABNORMAL HIGH (ref 3.6–11.0)

## 2014-07-28 LAB — BASIC METABOLIC PANEL
ANION GAP: 11 (ref 5–15)
BUN: 7 mg/dL (ref 6–20)
CO2: 28 mmol/L (ref 22–32)
CREATININE: 0.61 mg/dL (ref 0.44–1.00)
Calcium: 9.1 mg/dL (ref 8.9–10.3)
Chloride: 101 mmol/L (ref 101–111)
GFR calc Af Amer: >60 mL/min (ref >60)
GFR calc non Af Amer: >60 mL/min (ref >60)
Glucose, Bld: 124 mg/dL — ABNORMAL HIGH (ref 65–99)
Potassium: 3.6 mmol/L (ref 3.5–5.1)
Sodium: 140 mmol/L (ref 135–145)

## 2014-07-28 MED ORDER — CEPHALEXIN 500 MG PO CAPS: 500.0000 mg | ORAL_CAPSULE | Freq: Three times a day (TID) | ORAL | Status: AC

## 2014-07-28 MED ORDER — KETOROLAC TROMETHAMINE 60 MG/2ML IM SOLN
INTRAMUSCULAR | Status: AC
  Administered 2014-07-28: 60 mg via INTRAMUSCULAR
  Filled 2014-07-28: qty 2

## 2014-07-28 MED ORDER — OXYCODONE-ACETAMINOPHEN 5-325 MG PO TABS
ORAL_TABLET | ORAL | Status: AC
  Administered 2014-07-28: 2 via ORAL
  Filled 2014-07-28: qty 2

## 2014-07-28 MED ORDER — OXYCODONE HCL 5 MG PO TABS: 5.0000 mg | ORAL_TABLET | Freq: Four times a day (QID) | ORAL | Status: AC | PRN

## 2014-07-28 MED ORDER — KETOROLAC TROMETHAMINE 60 MG/2ML IM SOLN
60.0000 mg | Freq: Once | INTRAMUSCULAR | Status: AC
Start: 2014-07-28 — End: 2014-07-28
  Administered 2014-07-28: 60 mg via INTRAMUSCULAR

## 2014-07-28 MED ORDER — OXYCODONE-ACETAMINOPHEN 5-325 MG PO TABS
2.0000 | ORAL_TABLET | Freq: Once | ORAL | Status: AC
  Administered 2014-07-28: 2 via ORAL

## 2014-07-28 NOTE — Discharge Instructions (Signed)
Cellulitis Cellulitis is an infection of the skin and the tissue beneath it. The infected area is usually red and tender. Cellulitis occurs most often in the arms and lower legs.  CAUSES  Cellulitis is caused by bacteria that enter the skin through cracks or cuts in the skin. The most common types of bacteria that cause cellulitis are staphylococci and streptococci. SIGNS AND SYMPTOMS   Redness and warmth.  Swelling.  Tenderness or pain.  Fever. DIAGNOSIS  Your health care provider can usually determine what is wrong based on a physical exam. Blood tests may also be done. TREATMENT  Treatment usually involves taking an antibiotic medicine. HOME CARE INSTRUCTIONS   Take your antibiotic medicine as directed by your health care provider. Finish the antibiotic even if you start to feel better.  Keep the infected arm or leg elevated to reduce swelling.  Apply a warm cloth to the affected area up to 4 times per day to relieve pain.  Take medicines only as directed by your health care provider.  Keep all follow-up visits as directed by your health care provider. SEEK MEDICAL CARE IF:   You notice red streaks coming from the infected area.  Your red area gets larger or turns dark in color.  Your bone or joint underneath the infected area becomes painful after the skin has healed.  Your infection returns in the same area or another area.  You notice a swollen bump in the infected area.  You develop new symptoms.  You have a fever. SEEK IMMEDIATE MEDICAL CARE IF:   You feel very sleepy.  You develop vomiting or diarrhea.  You have a general ill feeling (malaise) with muscle aches and pains. MAKE SURE YOU:   Understand these instructions.  Will watch your condition.  Will get help right away if you are not doing well or get worse. Document Released: 11/09/2004 Document Revised: 06/16/2013 Document Reviewed: 04/17/2011 [facility] Patient Information 2015 [place], [place].  This information is not intended to replace advice given to you by your health care provider. Make sure you discuss any questions you have with your health care provider. You were prescribed a medication that is potentially sedating. Do not drink alcohol, drive or participate in any other potentially dangerous activities while taking this medication as it may make you sleepy. Do not take this medication with any other sedating medications, either prescription or over-the-counter. If you were prescribed Percocet or Vicodin, do not take these with acetaminophen (Tylenol) as it is already contained within these medications.   Opioid pain medications (or "narcotics") can be habit forming.  Use it as little as possible to achieve adequate pain control.  Do not use or use it with extreme caution if you have a history of opiate abuse or dependence.  If you are on a pain contract with your primary care doctor or a pain specialist, be sure to let them know you were prescribed this medication today from the [facility] Emergency Department.  This medication is intended for your use only - do not give any to anyone else and keep it in a secure place where nobody else, especially children and pets, have access to it.  It will also cause or worsen constipation, so you may want to consider taking an over-the-counter stool softener while you are taking this medication.

## 2014-07-28 NOTE — ED Provider Notes (Signed)
[facility] Emergency Department Provider Note  ____________________________________________  Time seen: 12:40 PM  I have reviewed the triage vital signs and the nursing notes.   HISTORY  Chief Complaint Leg Swelling    HPI JAYCELYN ORRISON is a 34 y.o. female who complains of swelling and pain in both lower extremities. She has chronic pain in the legs and peripheral neuropathy and takes Vicodin and gabapentin and Wellbutrin. However, over the last few days she's noted that these medications are not controlling her pain adequately. She denies any fevers chills chest pain shortness of breath abdominal pain vomiting diarrhea. She is eating and drinking normally. No dizziness or syncope.  Notably, the patient reports that she has chronic tachycardia with a resting heart rate upwards of 140 at times. Her current heart rate in the 120s not concerning to her. She is getting scheduled with cardiology by her primary care for follow-up of this finding.  She does note some redness on her lower abdominal wall for the past 3 or 4 days which is sore to the touch.     Past Medical History  Diagnosis Date  . Headache(784.0)   . 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 Hypertension   . Depression   . Anxiety   . H/O pyelonephritis   . Frequent UTI   . Irritable bowel syndrome with diarrhea   . Family history of anesthesia complication     pt is adopted  . Poor dentition   . Chronic pain syndrome   . Peripheral neuropathy   . Tachycardia   . Lumbago 03/27/2013  . Abnormality of gait 03/27/2013  . Disturbance of skin sensation 03/27/2013  . Dizziness and giddiness 03/27/2013  . Obesity   . Cervicalgia     Patient Active Problem List   Diagnosis Date Noted  . Lumbago 03/27/2013  . Abnormality of gait 03/27/2013  . Disturbance of skin sensation 03/27/2013  . Subjective visual disturbance 03/27/2013  . Dizziness and giddiness 03/27/2013     Past Surgical History  Procedure Laterality Date  . No past surgeries    . Craniotomy  11/10/2011    Procedure: CRANIOTOMY HYPOPHYSECTOMY TRANSNASAL APPROACH;  Surgeon: Winfield Cunas, MD;  Location: [place] [facility];  Service: Neurosurgery;  Laterality: N/A;  transphenoidal resection of pituitary tumor   . Sinus endo w/fusion  11/10/2011    Procedure: ENDOSCOPIC SINUS SURGERY WITH FUSION NAVIGATION;  Surgeon: Ruby Cola, MD;  Location: [facility];  Service: ENT;  Laterality: N/A;    Current Outpatient Rx  Name  Route  Sig  Dispense  Refill  . acetaminophen (TYLENOL) 500 MG tablet   Oral   Take 1,000 mg by mouth every 6 (six) hours as needed. For pain         . amLODipine-valsartan (EXFORGE) 10-320 MG per tablet   Oral   Take 1 tablet by mouth daily.         Marland Kitchen buPROPion (WELLBUTRIN XL) 300 MG 24 hr tablet   Oral   Take 300 mg by mouth daily.         . cephALEXin (KEFLEX) 500 MG capsule   Oral   Take 1 capsule (500 mg total) by mouth 3 (three) times daily.   21 capsule   0   . DULoxetine (CYMBALTA) 30 MG capsule   Oral   Take 90 mg by mouth daily.         Marland Kitchen gabapentin (NEURONTIN) 300  MG capsule   Oral   Take 600-900 mg by mouth 3 (three) times daily. 900mg  in the morning 600mg  at lunch 900 at bedtime         . HYDROcodone-acetaminophen (NORCO) 10-325 MG per tablet   Oral   Take 1 tablet by mouth every 6 (six) hours as needed. For pain         . ibuprofen (ADVIL,MOTRIN) 200 MG tablet   Oral   Take 800 mg by mouth every 6 (six) hours as needed. For pain         . Multiple Vitamin (MULTIVITAMIN WITH MINERALS) TABS   Oral   Take 1 tablet by mouth daily.         . Nebivolol HCl (BYSTOLIC) 20 MG TABS   Oral   Take 20 mg by mouth 2 (two) times daily.          Marland Kitchen oxyCODONE (ROXICODONE) 5 MG immediate release tablet   Oral   Take 1 tablet (5 mg total) by mouth every 6 (six) hours as needed for breakthrough pain.   12 tablet   0   . traMADol  (ULTRAM) 50 MG tablet   Oral   Take 100 mg by mouth every 8 (eight) hours as needed.         . vitamin B-12 (CYANOCOBALAMIN) 1000 MCG tablet   Oral   Take 1,000 mcg by mouth daily.           Allergies Review of patient's allergies indicates no known allergies.  Family History  Problem Relation Age of Onset  . Adopted: Yes    Social History History  Substance Use Topics  . Smoking status: Former Smoker -- 1.00 packs/day for 16 years    Types: Cigarettes    Quit date: 09/01/2012  . Smokeless tobacco: Never Used  . Alcohol Use: No     Comment: quit 2007    Review of Systems  Constitutional: No fever or chills. No weight changes Eyes:No blurry vision or double vision.  ENT: No sore throat. Cardiovascular: No chest pain. Respiratory: No dyspnea or cough. Gastrointestinal: Abdominal wall redness, without, vomiting and diarrhea.  No BRBPR or melena. Genitourinary: Negative for dysuria, urinary retention, bloody urine, or difficulty urinating. Musculoskeletal: Negative for back pain. No joint swelling or pain. Skin: Negative for rash. Neurological: Negative for headaches, focal weakness or numbness. Psychiatric:No anxiety or depression.   Endocrine:No hot/cold intolerance, changes in energy, or sleep difficulty.  10-point ROS otherwise negative.  ____________________________________________   PHYSICAL EXAM:  VITAL SIGNS: ED Triage Vitals  Enc Vitals Group     BP 07/28/14 1114 142/93 mmHg     Pulse Rate 07/28/14 1114 123     Resp 07/28/14 1114 18     Temp 07/28/14 1114 98.5 F (36.9 C)     Temp Source 07/28/14 1114 Oral     SpO2 07/28/14 1114 100 %     Weight 07/28/14 1114 190 lb (86.183 kg)     Height 07/28/14 1114 5\' 2"  (1.575 m)     Head Cir --      Peak Flow --      Pain Score 07/28/14 1114 10     Pain Loc --      Pain Edu? --      Excl. in [place]? --      Constitutional: Alert and oriented. Well appearing and in no distress. Eyes: No scleral  icterus. No conjunctival pallor. PERRL. EOMI ENT   Head: Normocephalic and  atraumatic.   Nose: No congestion/rhinnorhea. No septal hematoma   Mouth/Throat: MMM, no pharyngeal erythema. No peritonsillar mass. No uvula shift.   Neck: No stridor. No SubQ emphysema. No meningismus. Hematological/Lymphatic/Immunilogical: No cervical lymphadenopathy. Cardiovascular: RRR. Normal and symmetric distal pulses are present in all extremities. No murmurs, rubs, or gallops. Respiratory: Normal respiratory effort without tachypnea nor retractions. Breath sounds are clear and equal bilaterally. No wheezes/rales/rhonchi. Gastrointestinal: Soft and nontender, except for a area of splotchy erythema covering the lower abdominal wall in the suprapubic and right lower quadrant area. This is primarily worse under the patient's pannus where the skin folds are rubbing together, and she has about a 2 cm area of denuded skin and a small vesicle forming. The area is erythematous, tender to the touch, indurated, warm to the touch. There is no drainage or fluctuant abscess palpable. There is no deep abdominal tenderness. No distention. There is no CVA tenderness.  No rebound, rigidity, or guarding. Genitourinary: deferred Musculoskeletal: Nontender with normal range of motion in all extremities. No joint effusions.  No lower extremity tenderness.  Trace bilateral pedal edema. Negative Homans sign Neurologic:   Normal speech and language.  CN 2-10 normal. Motor grossly intact. No pronator drift.  Normal gait. No gross focal neurologic deficits are appreciated.  Skin:  Skin is warm, dry and intact. No rash noted.  No petechiae, purpura, or bullae. Psychiatric: Mood and affect are normal. Speech and behavior are normal. Patient exhibits appropriate insight and judgment.  ____________________________________________    LABS (pertinent positives/negatives) (all labs ordered are listed, but only abnormal results  are displayed) Labs Reviewed  CBC WITH DIFFERENTIAL/PLATELET - Abnormal; Notable for the following:    WBC 11.6 (*)    RDW 14.8 (*)    Neutro Abs 9.3 (*)    All other components within normal limits  BASIC METABOLIC PANEL - Abnormal; Notable for the following:    Glucose, Bld 124 (*)    All other components within normal limits  URINALYSIS COMPLETEWITH MICROSCOPIC ([facility] ONLY)   ____________________________________________   EKG    ____________________________________________    RADIOLOGY    ____________________________________________   PROCEDURES  ____________________________________________   INITIAL IMPRESSION / ASSESSMENT AND PLAN / ED COURSE  Pertinent labs & imaging results that were available during my care of the patient were reviewed by me and considered in my medical decision making (see chart for details).  Patient appears to be at her baseline except for a cellulitis of the abdominal wall due to her pannus folds causing some repetitive local microtrauma. No evidence of abscess or deep tissue infection. This appears to be a superficial soft tissue infection of the skin and fat layers. We'll start the patient on Keflex. I did counsel her on her chronic pain flaring up in the bilateral lower extremities, which is likely due to her acute phase of illness. We'll give her oxycodone when necessary for 2 days while waiting for her symptoms to start improving with antibiotic initiation. No sepsis, hemodynamically stable, nontoxic well-appearing medically stable.  ____________________________________________   FINAL CLINICAL IMPRESSION(S) / ED DIAGNOSES  Final diagnoses:  Cellulitis of abdominal wall      Carrie Mew, MD 07/28/14 1428

## 2014-07-28 NOTE — ED Notes (Addendum)
Pt reports that she is having swelling in both legs. She reports that this happened in Oct and she had a blood infection. She reports that she is in a lot of pain and has been taking Vicodin 10 and it is not touching the pain. Both legs are swollen, pulses are present no redness, right leg is cool left one is warm.

## 2015-07-04 ENCOUNTER — Encounter: Payer: Self-pay | Admitting: Emergency Medicine

## 2015-07-04 ENCOUNTER — Emergency Department
Admission: EM | Admit: 2015-07-04 | Discharge: 2015-07-04 | Discharge status: Against Medical Advice | Payer: Medicaid Other | Attending: Emergency Medicine | Admitting: Emergency Medicine

## 2015-07-04 ENCOUNTER — Emergency Department: Payer: Medicaid Other

## 2015-07-04 DIAGNOSIS — F329 Major depressive disorder, single episode, unspecified: Secondary | ICD-10-CM | POA: Diagnosis not present

## 2015-07-04 DIAGNOSIS — Z792 Long term (current) use of antibiotics: Secondary | ICD-10-CM | POA: Diagnosis not present

## 2015-07-04 DIAGNOSIS — I10 Essential (primary) hypertension: Secondary | ICD-10-CM | POA: Diagnosis not present

## 2015-07-04 DIAGNOSIS — M79671 Pain in right foot: Secondary | ICD-10-CM | POA: Diagnosis present

## 2015-07-04 DIAGNOSIS — Z87891 Personal history of nicotine dependence: Secondary | ICD-10-CM | POA: Diagnosis not present

## 2015-07-04 DIAGNOSIS — Z79891 Long term (current) use of opiate analgesic: Secondary | ICD-10-CM | POA: Diagnosis not present

## 2015-07-04 DIAGNOSIS — L03115 Cellulitis of right lower limb: Secondary | ICD-10-CM | POA: Insufficient documentation

## 2015-07-04 DIAGNOSIS — Z79899 Other long term (current) drug therapy: Secondary | ICD-10-CM | POA: Diagnosis not present

## 2015-07-04 LAB — CBC WITH DIFFERENTIAL/PLATELET
Basophils Absolute: 0.1 10*3/uL (ref 0–0.1)
Basophils Relative: 1 %
EOS ABS: 0.6 10*3/uL (ref 0–0.7)
HCT: 39.8 % (ref 35.0–47.0)
Hemoglobin: 13.9 g/dL (ref 12.0–16.0)
LYMPHS ABS: 1.4 10*3/uL (ref 1.0–3.6)
Lymphocytes Relative: 14 %
MCH: 30.8 pg (ref 26.0–34.0)
MCHC: 34.9 g/dL (ref 32.0–36.0)
MCV: 88.3 fL (ref 80.0–100.0)
Monocytes Absolute: 0.8 10*3/uL (ref 0.2–0.9)
Neutro Abs: 7.6 10*3/uL — ABNORMAL HIGH (ref 1.4–6.5)
Neutrophils Relative %: 73 %
PLATELETS: 354 10*3/uL (ref 150–440)
RBC: 4.5 MIL/uL (ref 3.80–5.20)
RDW: 13.2 % (ref 11.5–14.5)
WBC: 10.4 10*3/uL (ref 3.6–11.0)

## 2015-07-04 LAB — BASIC METABOLIC PANEL
Anion gap: 9 (ref 5–15)
BUN: 5 mg/dL — ABNORMAL LOW (ref 6–20)
CHLORIDE: 95 mmol/L — AB (ref 101–111)
CO2: 35 mmol/L — ABNORMAL HIGH (ref 22–32)
Calcium: 9.3 mg/dL (ref 8.9–10.3)
Creatinine, Ser: 0.47 mg/dL (ref 0.44–1.00)
GFR calc Af Amer: >60 mL/min (ref >60)
GLUCOSE: 115 mg/dL — AB (ref 65–99)
POTASSIUM: 3 mmol/L — AB (ref 3.5–5.1)
Sodium: 139 mmol/L (ref 135–145)

## 2015-07-04 MED ORDER — SODIUM CHLORIDE 0.9 % IV BOLUS (SEPSIS): 1000.0000 mL | Freq: Once | INTRAVENOUS | Status: DC

## 2015-07-04 MED ORDER — KETOROLAC TROMETHAMINE 30 MG/ML IJ SOLN
30.0000 mg | Freq: Once | INTRAMUSCULAR | Status: DC
  Filled 2015-07-04: qty 1

## 2015-07-04 MED ORDER — VANCOMYCIN HCL IN DEXTROSE 1-5 GM/200ML-% IV SOLN
1000.0000 mg | Freq: Once | INTRAVENOUS | Status: DC
  Filled 2015-07-04: qty 200

## 2015-07-04 NOTE — ED Notes (Signed)
Labs (including 1 set Blood Cultures) sent.

## 2015-07-04 NOTE — ED Notes (Signed)
Went into patient's room to start IV for fluids and antibiotics.  Patient states she does not want an IV until the doctor changes her medication from Toradol to something else.  Patient requesting to speak to the doctor. Dr. Archie Balboa to room to speak with patient about the pain medication.  Patient voiced that she would be leaving.  Patient did not want for atbx RX and walked out before she could e-sign for AMA.  Patient ambulatory to lobby with significant other.

## 2015-07-04 NOTE — ED Provider Notes (Signed)
[facility] Emergency Department Provider Note   ____________________________________________  Time seen: ~1415  I have reviewed the triage vital signs and the nursing notes.   HISTORY  Chief Complaint Foot Swelling   History limited by: Not Limited   HPI Kristina Carpenter is a 35 y.o. female who presents to the emergency department today because of concerns for pain and swelling and redness. The pain swelling and redness is occurring in her right foot. It started 2 days ago. Has gradually gotten worse since that time. The patient denies any traumatic injury to her foot. Denies any scratches or other skin breaks to the foot that she is aware of.Subjective low-grade fevers. No nausea or vomiting. Similar symptoms roughly a year and a half ago with cellulitis of the same foot.    Past Medical History  Diagnosis Date  . Headache(784.0)   . Pituitary neoplasm ([place])     Cyst resection, not pituitary tumor  . Hypertension   . Depression   . Anxiety   . H/O pyelonephritis   . Frequent UTI   . Irritable bowel syndrome with diarrhea   . Family history of anesthesia complication     pt is adopted  . Poor dentition   . Chronic pain syndrome   . Peripheral neuropathy ([place])   . Tachycardia   . Lumbago 03/27/2013  . Abnormality of gait 03/27/2013  . Disturbance of skin sensation 03/27/2013  . Dizziness and giddiness 03/27/2013  . Obesity   . Cervicalgia     Patient Active Problem List   Diagnosis Date Noted  . Lumbago 03/27/2013  . Abnormality of gait 03/27/2013  . Disturbance of skin sensation 03/27/2013  . Subjective visual disturbance 03/27/2013  . Dizziness and giddiness 03/27/2013    Past Surgical History  Procedure Laterality Date  . No past surgeries    . Craniotomy  11/10/2011    Procedure: CRANIOTOMY HYPOPHYSECTOMY TRANSNASAL APPROACH;  Surgeon: Winfield Cunas, MD;  Location: [place] [facility];  Service: Neurosurgery;  Laterality: N/A;   transphenoidal resection of pituitary tumor   . Sinus endo w/fusion  11/10/2011    Procedure: ENDOSCOPIC SINUS SURGERY WITH FUSION NAVIGATION;  Surgeon: Ruby Cola, MD;  Location: [facility];  Service: ENT;  Laterality: N/A;    Current Outpatient Rx  Name  Route  Sig  Dispense  Refill  . acetaminophen (TYLENOL) 500 MG tablet   Oral   Take 1,000 mg by mouth every 6 (six) hours as needed. For pain         . buPROPion (WELLBUTRIN XL) 300 MG 24 hr tablet   Oral   Take 300 mg by mouth daily.         . cephALEXin (KEFLEX) 500 MG capsule   Oral   Take 1 capsule (500 mg total) by mouth 3 (three) times daily.   21 capsule   0   . clonazePAM (KLONOPIN) 1 MG tablet   Oral   Take 1 tablet by mouth 3 (three) times daily.      5   . DULoxetine (CYMBALTA) 30 MG capsule   Oral   Take 90 mg by mouth daily.         Marland Kitchen gabapentin (NEURONTIN) 300 MG capsule   Oral   Take 600-900 mg by mouth 3 (three) times daily. 900mg  in the morning 600mg  at lunch 900 at bedtime         . HYDROcodone-acetaminophen (NORCO) 10-325 MG per tablet   Oral  Take 1 tablet by mouth every 6 (six) hours as needed. For pain         . ibuprofen (ADVIL,MOTRIN) 200 MG tablet   Oral   Take 800 mg by mouth every 6 (six) hours as needed. For pain         . metoprolol (LOPRESSOR) 50 MG tablet   Oral   Take 1 tablet by mouth 2 (two) times daily.      0   . Multiple Vitamin (MULTIVITAMIN WITH MINERALS) TABS   Oral   Take 1 tablet by mouth daily.         . Nebivolol HCl (BYSTOLIC) 20 MG TABS   Oral   Take 20 mg by mouth 2 (two) times daily.          Marland Kitchen oxyCODONE (ROXICODONE) 5 MG immediate release tablet   Oral   Take 1 tablet (5 mg total) by mouth every 6 (six) hours as needed for breakthrough pain.   12 tablet   0   . traMADol (ULTRAM) 50 MG tablet   Oral   Take 100 mg by mouth every 8 (eight) hours as needed.         . vitamin B-12 (CYANOCOBALAMIN) 1000 MCG tablet   Oral   Take  1,000 mcg by mouth daily.           Allergies Amlodipine and Tramadol  Family History  Problem Relation Age of Onset  . Adopted: Yes    Social History Social History  Substance Use Topics  . Smoking status: Former Smoker -- 1.00 packs/day for 16 years    Types: Cigarettes    Quit date: 09/01/2012  . Smokeless tobacco: Never Used  . Alcohol Use: No     Comment: quit 2007    Review of Systems  Constitutional: Positive for fever. Cardiovascular: Negative for chest pain. Respiratory: Negative for shortness of breath. Gastrointestinal: Negative for abdominal pain, vomiting and diarrhea. Genitourinary: Negative for dysuria. Musculoskeletal: Positive for right foot swelling. Positive for right foot pain Skin: Positive for right foot redness Neurological: Negative for headaches, focal weakness or numbness.   10-point ROS otherwise negative.  ____________________________________________   PHYSICAL EXAM:  VITAL SIGNS: ED Triage Vitals  Enc Vitals Group     BP 07/04/15 1143 133/94 mmHg     Pulse Rate 07/04/15 1143 110     Resp 07/04/15 1143 18     Temp 07/04/15 1143 99 F (37.2 C)     Temp Source 07/04/15 1143 Oral     SpO2 07/04/15 1143 99 %     Weight 07/04/15 1143 190 lb (86.183 kg)     Height 07/04/15 1143 5\' 2"  (1.575 m)   Constitutional: Alert and oriented. Well appearing and in no distress. Eyes: Conjunctivae are normal. PERRL. Normal extraocular movements. ENT   Head: Normocephalic and atraumatic.   Nose: No congestion/rhinnorhea.   Mouth/Throat: Mucous membranes are moist.   Neck: No stridor. Hematological/Lymphatic/Immunilogical: No cervical lymphadenopathy. Cardiovascular: Normal rate, regular rhythm.  No murmurs, rubs, or gallops. Respiratory: Normal respiratory effort without tachypnea nor retractions. Breath sounds are clear and equal bilaterally. No wheezes/rales/rhonchi. Gastrointestinal: Soft and nontender. No distention. There is  no CVA tenderness. Genitourinary: Deferred Musculoskeletal: Swelling to the dorsum of the right foot with associated erythema. Slightly tender to palpation. Dorsalis pedis 2+. Able to move all toes. Neurologic:  Normal speech and language. No gross focal neurologic deficits are appreciated.  Skin:  Skin is warm, dry and intact. No rash  noted. Psychiatric: Mood and affect are normal. Speech and behavior are normal. Patient exhibits appropriate insight and judgment.  ____________________________________________    LABS (pertinent positives/negatives)  Labs Reviewed  CBC WITH DIFFERENTIAL/PLATELET - Abnormal; Notable for the following:    Neutro Abs 7.6 (*)    All other components within normal limits  BASIC METABOLIC PANEL - Abnormal; Notable for the following:    Potassium 3.0 (*)    Chloride 95 (*)    CO2 35 (*)    Glucose, Bld 115 (*)    BUN 5 (*)    All other components within normal limits     ____________________________________________   EKG  None  ____________________________________________    RADIOLOGY  Right foot x-ray IMPRESSION: Significant soft tissue swelling with no etiology identified. ____________________________________________   PROCEDURES  Procedure(s) performed: None  Critical Care performed: No  ____________________________________________   INITIAL IMPRESSION / ASSESSMENT AND PLAN / ED COURSE  Pertinent labs & imaging results that were available during my care of the patient were reviewed by me and considered in my medical decision making (see chart for details).  Patient presents to the emergency department today with concerns for pain and swelling to the right foot. Exam is consistent with a cellulitis to the dorsum of the foot. Patient mildly tachycardic. Will plan on giving IV fluids and a dose of IV vancomycin here. No leukocytosis. Think likely will be able to discharge home on oral  antibiotics.  ----------------------------------------- 3:09 PM on 07/04/2015 -----------------------------------------  Patient refusing to allow nursing staff to place an IV until she states she gets pain medication. I did discuss with the patient that I had written for some Toradol IV. Patient states that the Toradol would do nothing. I discussed with patient that the fact she is already on OxyContin and that she does not feel any great relief I wanted to try different class of pain medication. I did try to discuss with the patient that my primary reason for wanting an IV was to give IV antibiotics. The patient however again refused the IV or IV antibiotics. She stated she would just leave. I did offer to write a prescription for antibiotics however the patient declined wanting even that saying she would call her primary care doctor. The patient did leave the emergency department prior to obtaining a prescription for antibiotics. She was however observed walking easily out of the emergency department.  ____________________________________________   FINAL CLINICAL IMPRESSION(S) / ED DIAGNOSES  Final diagnoses:  Cellulitis of right lower extremity     Note: This dictation was prepared with Dragon dictation. Any transcriptional errors that result from this process are unintentional    Nance Pear, MD 07/04/15 1512

## 2015-07-04 NOTE — ED Notes (Signed)
Onset 2 days ago with non-traumatic right foot pain, swelling and erythema. Hx of similar episode 2 years ago to same location.

## 2015-11-07 IMAGING — CT CT ANGIO CHEST
2 of 6 series · 18 of 36 positions shown · IV contrast (APPLIED)
Comparison: Chest radiograph same date. No prior chest CT for
comparison.

CLINICAL DATA: Syncope, chest pain

EXAM:
CT ANGIOGRAPHY CHEST WITH CONTRAST
TECHNIQUE: Multidetector CT imaging of the chest was performed using the
standard protocol during bolus administration of intravenous
contrast. Multiplanar CT image reconstructions and MIPs were
obtained to evaluate the vascular anatomy.
CONTRAST:  75 cc Omnipaque 350 IV contrast

[Series 5: pe 1.0 thins · axial · 0.68mm/px · z∈[-22,+266]mm · 17 of 324 slices shown]
[im 18/324  lung]
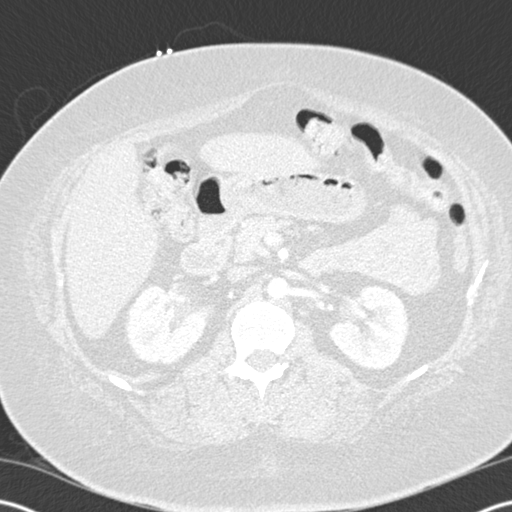
[im 36/324  mediastinal]
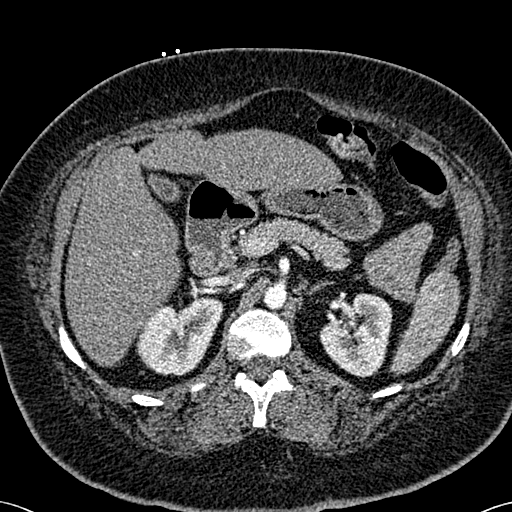
[im 54/324  lung]
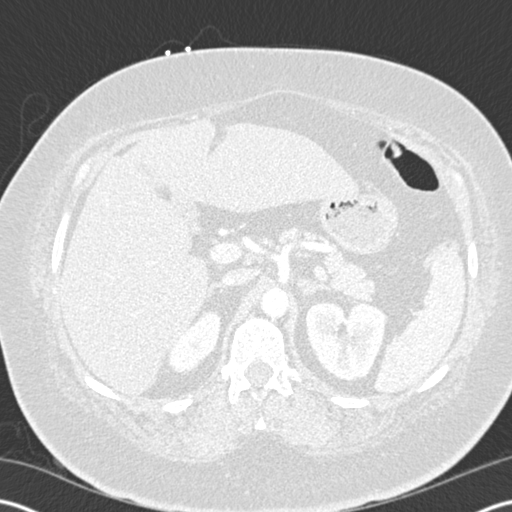
[im 72/324  mediastinal]
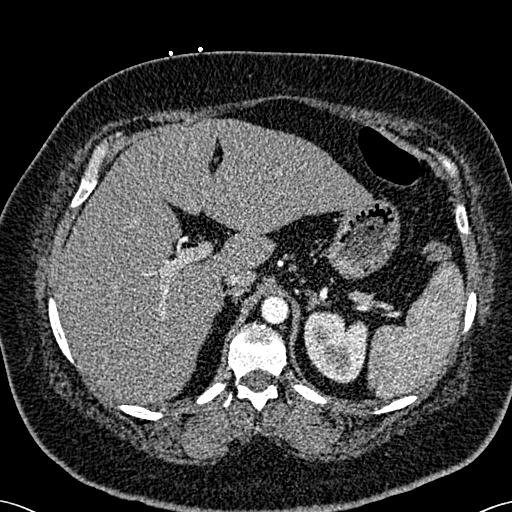
[im 90/324  lung]
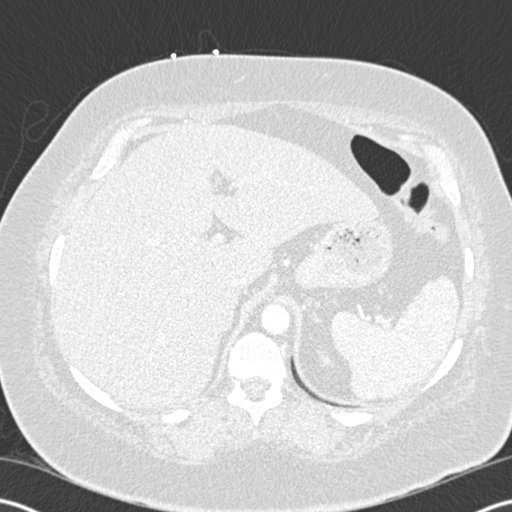
[im 108/324  mediastinal]
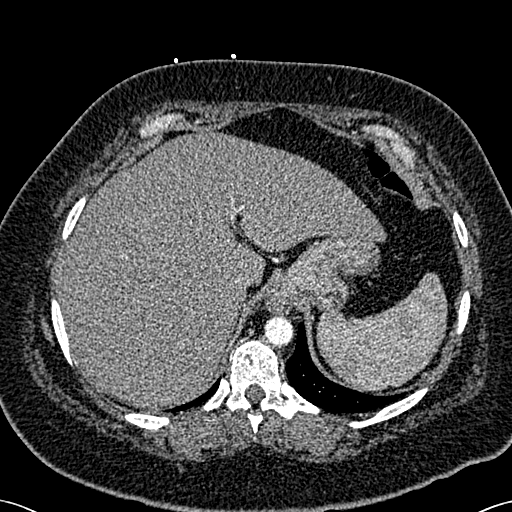
[im 126/324  lung]
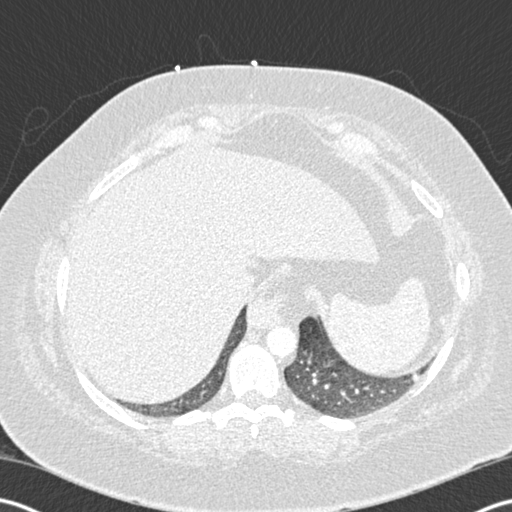
[im 144/324  mediastinal]
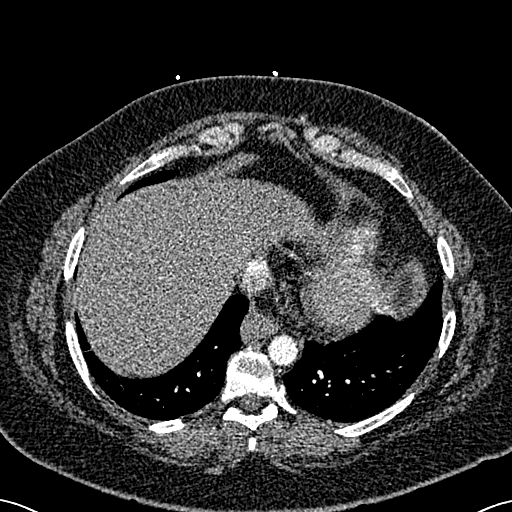
[im 162/324  lung]
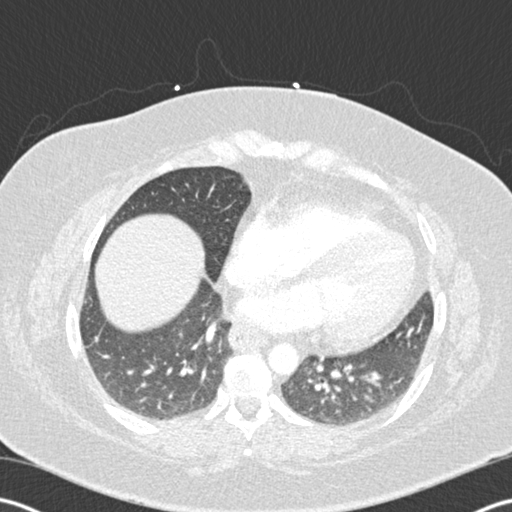
[im 180/324  mediastinal]
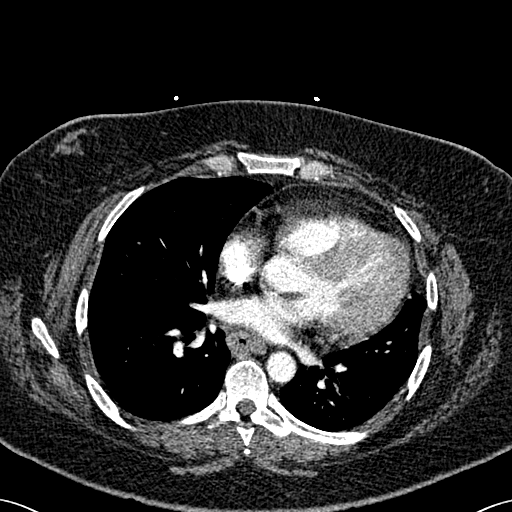
[im 198/324  lung]
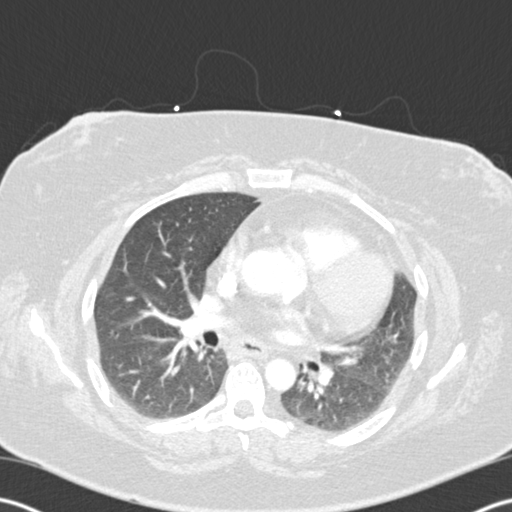
[im 216/324  mediastinal]
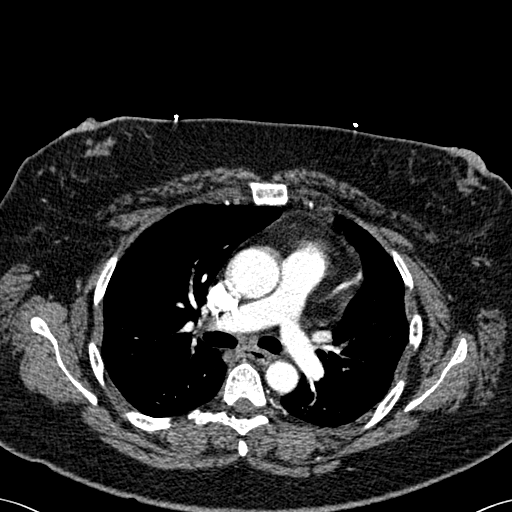
[im 234/324  lung]
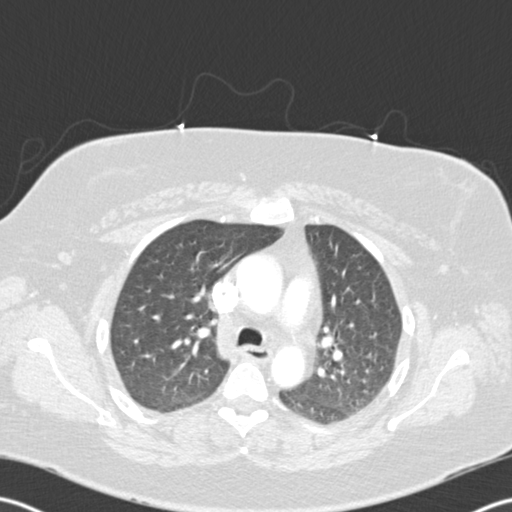
[im 252/324  mediastinal]
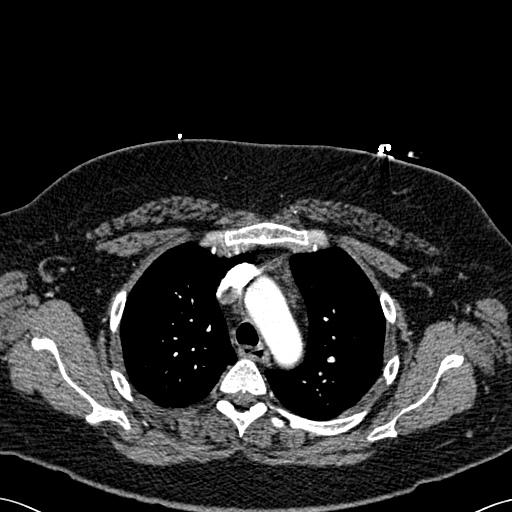
[im 270/324  lung]
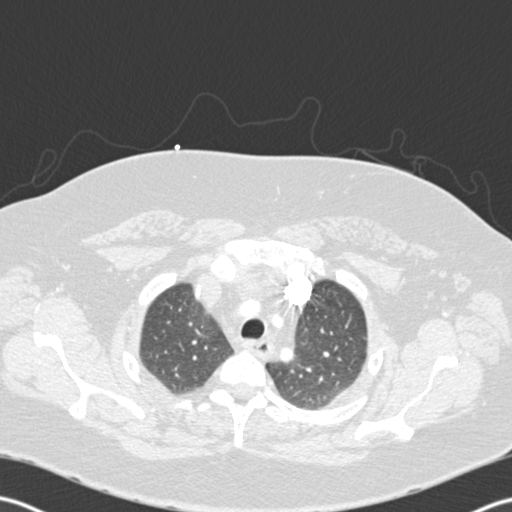
[im 288/324  mediastinal]
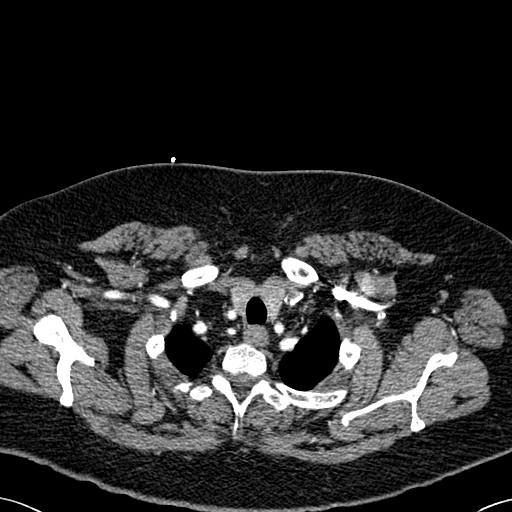
[im 306/324  lung]
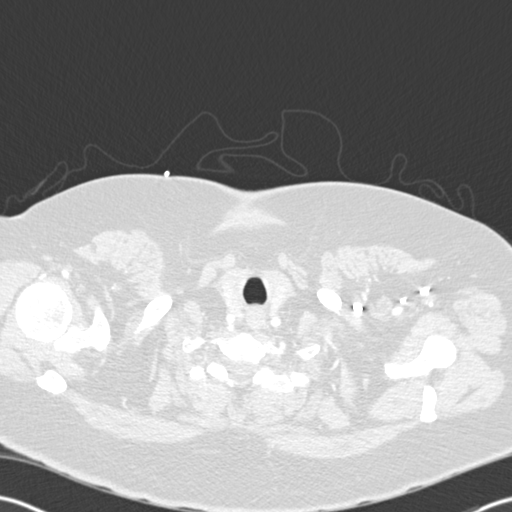

[Series 7: cor pe 2.0 mpr · coronal · 0.61mm/px · 1 of 138 slices shown]
[im 69/138  mediastinal]
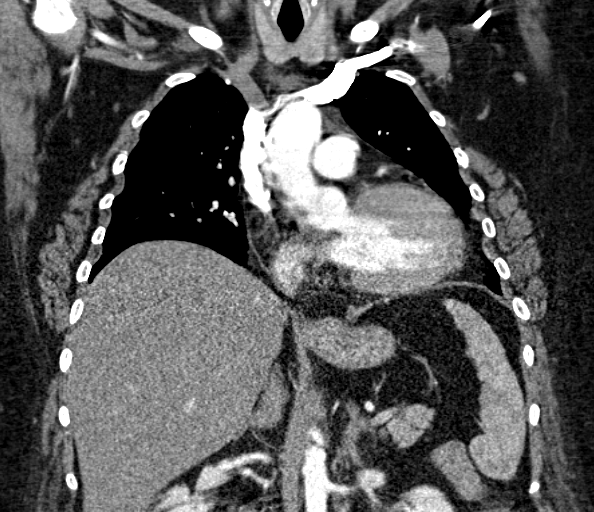

[18 of 36 positions shown; findings below may reference images not displayed]

FINDINGS: Mediastinum/Nodes: Mild cardiomegaly noted. Mild prominence of the
epicardial fat pad. No pericardial effusion. Small hiatal hernia. No
lymphadenopathy.

Mild inhomogeneity of the contrast bolus is identified which may
decrease the sensitivity and specificity for detection of acute
pulmonary emboli. No central focal filling defect is noted up to the
level of the third order pulmonary arteries to suggest pulmonary
embolism.

Lungs/Pleura: Curvilinear bilateral lower lobe scarring or
atelectasis is noted. Lungs are otherwise grossly clear. No pleural
effusion.

Upper abdomen: Normal

Musculoskeletal: No acute osseous abnormality.

Review of the MIP images confirms the above findings.
IMPRESSION: No acute cardiopulmonary process. Suboptimal contrast bolus timing
decrease the sensitivity and specificity for detection of pulmonary
emboli but no central filling defect is identified up to the level
of the third order pulmonary arteries to suggest acute pulmonary
embolism.
